# Patient Record
Sex: Female | Born: 1970 | Race: Black or African American | Hispanic: No | Marital: Single | Smoking: Never smoker
Health system: Southern US, Community
[De-identification: ages and names within clinical notes are randomized; demographics above are authoritative.]

## PROBLEM LIST (undated history)

## (undated) DIAGNOSIS — K429 Umbilical hernia without obstruction or gangrene: Secondary | ICD-10-CM

## (undated) DIAGNOSIS — D496 Neoplasm of unspecified behavior of brain: Secondary | ICD-10-CM

## (undated) DIAGNOSIS — N83209 Unspecified ovarian cyst, unspecified side: Secondary | ICD-10-CM

## (undated) HISTORY — PX: EYE SURGERY: SHX253

## (undated) HISTORY — DX: Unspecified ovarian cyst, unspecified side: N83.209

---

## 2010-10-28 ENCOUNTER — Other Ambulatory Visit: Payer: Self-pay | Admitting: Family Medicine

## 2010-10-28 ENCOUNTER — Ambulatory Visit (HOSPITAL_COMMUNITY)
Admission: RE | Admit: 2010-10-28 | Discharge: 2010-10-28 | Payer: Self-pay | Source: Home / Self Care | Attending: Family Medicine | Admitting: Family Medicine

## 2011-11-06 ENCOUNTER — Encounter (HOSPITAL_COMMUNITY): Payer: Self-pay | Admitting: *Deleted

## 2011-11-06 ENCOUNTER — Inpatient Hospital Stay (HOSPITAL_COMMUNITY)
Admission: AD | Admit: 2011-11-06 | Discharge: 2011-11-06 | Disposition: A | Discharge status: Home / Self Care | Payer: Medicaid Other | Source: Ambulatory Visit | Attending: Obstetrics & Gynecology | Admitting: Obstetrics & Gynecology

## 2011-11-06 ENCOUNTER — Inpatient Hospital Stay (HOSPITAL_COMMUNITY): Payer: Medicaid Other

## 2011-11-06 DIAGNOSIS — O200 Threatened abortion: Secondary | ICD-10-CM | POA: Insufficient documentation

## 2011-11-06 HISTORY — DX: Neoplasm of unspecified behavior of brain: D49.6

## 2011-11-06 LAB — DIFFERENTIAL
Basophils Absolute: 0 10*3/uL (ref 0.0–0.1)
Basophils Relative: 0 % (ref 0–1)
Lymphocytes Relative: 26 % (ref 12–46)
Monocytes Relative: 9 % (ref 3–12)
Neutro Abs: 2.3 10*3/uL (ref 1.7–7.7)
Neutrophils Relative %: 62 % (ref 43–77)

## 2011-11-06 LAB — URINALYSIS, ROUTINE W REFLEX MICROSCOPIC
Leukocytes, UA: NEGATIVE
Nitrite: NEGATIVE
Protein, ur: NEGATIVE mg/dL
Specific Gravity, Urine: <1.005 — ABNORMAL LOW (ref 1.005–1.030)
Urobilinogen, UA: 0.2 mg/dL (ref 0.0–1.0)

## 2011-11-06 LAB — ABO/RH: ABO/RH(D): B POS

## 2011-11-06 LAB — CBC
HCT: 32.2 % — ABNORMAL LOW (ref 36.0–46.0)
Hemoglobin: 10.6 g/dL — ABNORMAL LOW (ref 12.0–15.0)
MCHC: 32.9 g/dL (ref 30.0–36.0)
WBC: 3.8 10*3/uL — ABNORMAL LOW (ref 4.0–10.5)

## 2011-11-06 LAB — URINE MICROSCOPIC-ADD ON

## 2011-11-06 LAB — POCT PREGNANCY, URINE: Preg Test, Ur: POSITIVE — AB

## 2011-11-06 LAB — WET PREP, GENITAL
Clue Cells Wet Prep HPF POC: NONE SEEN
WBC, Wet Prep HPF POC: NONE SEEN

## 2011-11-06 NOTE — Progress Notes (Signed)
Pt was seen in [place] for OB checkup on Monday, dx'd with UTI, prescribed antibiotic but is not taking this yet.  Pt came in EMS this morning because she had episode of light bleeding this a.m.  Pt states she had brown discharge yesterday.  Pt denies pain.

## 2011-11-06 NOTE — ED Provider Notes (Signed)
History     CSN: [id number]  Arrival date & time 11/06/11  1010   None     Chief Complaint  Patient presents with  . Vaginal Bleeding    HPI KEYDI GIEL is a 41 y.o. female @ [redacted]w[redacted]d gestation who presents to [facility]U via EMS for vaginal bleeding in early pregnancy. While visiting in [place] 3 days ago went to the ED for lower abdominal pain and was treated for a UTI with Macrobid. Had ultrasound at that visit and told was approximately [redacted] weeks pregnant and "something about tubes but she is not sure what". The history was provided by the patient.  Past Medical History  Diagnosis Date  . Brain tumor     Past Surgical History  Procedure Date  . No past surgeries     History reviewed. No pertinent family history.  History  Substance Use Topics  . Smoking status: Never Smoker   . Smokeless tobacco: Not on file  . Alcohol Use: No    OB History    Grav Para Term Preterm Abortions TAB SAB Ect Mult Living   4 3 3       3       Review of Systems  Constitutional: Negative for fever, chills, diaphoresis and fatigue.  HENT: Negative for ear pain, congestion, sore throat, facial swelling, neck pain, neck stiffness, dental problem and sinus pressure.   Eyes: Negative for photophobia, pain and discharge.  Respiratory: Negative for cough, chest tightness and wheezing.   Cardiovascular: Negative.   Gastrointestinal: Negative for nausea, vomiting, abdominal pain, diarrhea, constipation and abdominal distention.  Genitourinary: Positive for frequency, vaginal bleeding and vaginal discharge. Negative for dysuria, urgency, flank pain, difficulty urinating, vaginal pain and pelvic pain.  Musculoskeletal: Positive for back pain. Negative for myalgias and gait problem.  Skin: Negative for color change and rash.  Neurological: Negative for dizziness, speech difficulty, weakness, light-headedness, numbness and headaches.  Psychiatric/Behavioral: Negative for confusion and agitation.    Allergies    Review of patient's allergies indicates no known allergies.  Home Medications  No current outpatient prescriptions on file.  BP 127/77  Pulse 81  Temp(Src) 97.6 F (36.4 C) (Oral)  Resp 22  LMP 09/18/2011  Physical Exam  Nursing note and vitals reviewed. Constitutional: She is oriented to person, place, and time. She appears well-developed and well-nourished.  HENT:  Head: Normocephalic.  Eyes: Conjunctivae and EOM are normal.  Neck: Neck supple.  Cardiovascular: Normal rate.   Pulmonary/Chest: Effort normal.  Abdominal: Soft. There is no tenderness.  Genitourinary:       External genitalia without lesions. Scant blood vaginal vault. Cervix closed, long, no CMT, no adnexal tenderness, Unable to palpate uterus due to patient habitus.  Musculoskeletal: Normal range of motion.  Neurological: She is alert and oriented to person, place, and time. No cranial nerve deficit.  Skin: Skin is warm and dry.  Psychiatric: She has a normal mood and affect. Her behavior is normal. Judgment and thought content normal.   Results for orders placed during the hospital encounter of 11/06/11 (from the past 24 hour(s))  URINALYSIS, ROUTINE W REFLEX MICROSCOPIC     Status: Abnormal   Collection Time   11/06/11 10:21 AM      Component Value Range   Color, Urine STRAW (*) YELLOW    APPearance CLEAR  CLEAR    Specific Gravity, Urine <1.005 (*) 1.005 - 1.030    pH 5.5  5.0 - 8.0    Glucose, UA  NEGATIVE  NEGATIVE (mg/dL)   Hgb urine dipstick LARGE (*) NEGATIVE    Bilirubin Urine NEGATIVE  NEGATIVE    Ketones, ur NEGATIVE  NEGATIVE (mg/dL)   Protein, ur NEGATIVE  NEGATIVE (mg/dL)   Urobilinogen, UA 0.2  0.0 - 1.0 (mg/dL)   Nitrite NEGATIVE  NEGATIVE    Leukocytes, UA NEGATIVE  NEGATIVE   URINE MICROSCOPIC-ADD ON     Status: Abnormal   Collection Time   11/06/11 10:21 AM      Component Value Range   Squamous Epithelial / LPF MANY (*) RARE    RBC / HPF 3-6  <3 (RBC/hpf)  POCT PREGNANCY, URINE      Status: Abnormal   Collection Time   11/06/11 10:28 AM      Component Value Range   Preg Test, Ur POSITIVE (*) NEGATIVE   WET PREP, GENITAL     Status: Normal   Collection Time   11/06/11 10:54 AM      Component Value Range   Yeast Wet Prep HPF POC NONE SEEN  NONE SEEN    Trich, Wet Prep NONE SEEN  NONE SEEN    Clue Cells Wet Prep HPF POC NONE SEEN  NONE SEEN    WBC, Wet Prep HPF POC NONE SEEN  NONE SEEN   CBC     Status: Abnormal   Collection Time   11/06/11 10:55 AM      Component Value Range   WBC 3.8 (*) 4.0 - 10.5 (K/uL)   RBC 3.63 (*) 3.87 - 5.11 (MIL/uL)   Hemoglobin 10.6 (*) 12.0 - 15.0 (g/dL)   HCT 16.1 (*) 09.6 - 46.0 (%)   MCV 88.7  78.0 - 100.0 (fL)   MCH 29.2  26.0 - 34.0 (pg)   MCHC 32.9  30.0 - 36.0 (g/dL)   RDW 04.5  40.9 - 81.1 (%)   Platelets 166  150 - 400 (K/uL)  DIFFERENTIAL     Status: Normal   Collection Time   11/06/11 10:55 AM      Component Value Range   Neutrophils Relative 62  43 - 77 (%)   Neutro Abs 2.3  1.7 - 7.7 (K/uL)   Lymphocytes Relative 26  12 - 46 (%)   Lymphs Abs 1.0  0.7 - 4.0 (K/uL)   Monocytes Relative 9  3 - 12 (%)   Monocytes Absolute 0.3  0.1 - 1.0 (K/uL)   Eosinophils Relative 2  0 - 5 (%)   Eosinophils Absolute 0.1  0.0 - 0.7 (K/uL)   Basophils Relative 0  0 - 1 (%)   Basophils Absolute 0.0  0.0 - 0.1 (K/uL)  HCG, QUANTITATIVE, PREGNANCY     Status: Abnormal   Collection Time   11/06/11 10:55 AM      Component Value Range   hCG, Beta Chain, Quant, S 2360 (*) <5 (mIU/mL)  ABO/RH     Status: Normal   Collection Time   11/06/11 10:55 AM      Component Value Range   ABO/RH(D) B POS     Ultrasound today shows a 6 week 3 day IUP with Cardiac activity at 78  ED Course: discussed results with Dr. Macon Large in MAU and we will give patient instructions on threatened AB and a pregnancy verification letter to start prenatal care.   Procedures   Assessment: Threatened AB  Plan:  Start prenatal care   Pregnancy verification letter  given. MDM          Randon Somera  Damian Leavell, NP 11/06/11 1255

## 2011-11-06 NOTE — ED Provider Notes (Signed)
Attestation of Attending Supervision of Advanced Practitioner: Evaluation and management procedures were performed by the PA/NP/CNM/OB Fellow under my supervision/collaboration. Chart reviewed, and agree with management and plan.  Hernan Turnage, M.D. 11/06/2011 2:09 PM   

## 2011-11-07 LAB — GC/CHLAMYDIA PROBE AMP, GENITAL: Chlamydia, DNA Probe: NEGATIVE

## 2011-11-10 ENCOUNTER — Inpatient Hospital Stay (HOSPITAL_COMMUNITY)
Admission: AD | Admit: 2011-11-10 | Discharge: 2011-11-10 | Disposition: A | Discharge status: Home / Self Care | Payer: Medicaid Other | Source: Ambulatory Visit | Attending: Obstetrics & Gynecology | Admitting: Obstetrics & Gynecology

## 2011-11-10 ENCOUNTER — Encounter (HOSPITAL_COMMUNITY): Payer: Self-pay | Admitting: *Deleted

## 2011-11-10 ENCOUNTER — Inpatient Hospital Stay (HOSPITAL_COMMUNITY): Payer: Medicaid Other

## 2011-11-10 DIAGNOSIS — O034 Incomplete spontaneous abortion without complication: Secondary | ICD-10-CM

## 2011-11-10 DIAGNOSIS — O039 Complete or unspecified spontaneous abortion without complication: Secondary | ICD-10-CM | POA: Insufficient documentation

## 2011-11-10 LAB — CBC
HCT: 33.2 % — ABNORMAL LOW (ref 36.0–46.0)
Platelets: 206 10*3/uL (ref 150–400)
RBC: 3.74 MIL/uL — ABNORMAL LOW (ref 3.87–5.11)
RDW: 14.3 % (ref 11.5–15.5)
WBC: 4.6 10*3/uL (ref 4.0–10.5)

## 2011-11-10 MED ORDER — IBUPROFEN 800 MG PO TABS: 800.0000 mg | ORAL_TABLET | Freq: Three times a day (TID) | ORAL | Status: AC | PRN

## 2011-11-10 NOTE — ED Provider Notes (Signed)
History     Chief Complaint  Patient presents with  . Vaginal Bleeding   HPI 41 y.o. G4P3003 at [redacted]w[redacted]d with vaginal bleeding, started spotting on 2/6, increased today. No pain. IUP on u/s 2/7, 7 week size with FHR 78.    Past Medical History  Diagnosis Date  . Brain tumor     Past Surgical History  Procedure Date  . No past surgeries     History reviewed. No pertinent family history.  History  Substance Use Topics  . Smoking status: Never Smoker   . Smokeless tobacco: Not on file  . Alcohol Use: No    Allergies: No Known Allergies  Prescriptions prior to admission  Medication Sig Dispense Refill  . bromocriptine (PARLODEL) 2.5 MG tablet Take 2.5 mg by mouth daily.      . nitrofurantoin (MACRODANTIN) 100 MG capsule Take 100 mg by mouth 4 (four) times daily.      . Prenatal Vit-Fe Fumarate-FA (SE-NATAL 19 PO) Take 1 tablet by mouth daily.        Review of Systems  Constitutional: Negative.   Respiratory: Negative.   Cardiovascular: Negative.   Gastrointestinal: Negative for nausea, vomiting, abdominal pain, diarrhea and constipation.  Genitourinary: Negative for dysuria, urgency, frequency, hematuria and flank pain.       Positive for vaginal bleeding   Musculoskeletal: Negative.   Neurological: Negative.   Psychiatric/Behavioral: Negative.    Physical Exam   Blood pressure 112/68, pulse 65, temperature 98.2 F (36.8 C), temperature source Oral, resp. rate 20, height 5' 3.5" (1.613 m), weight 100.245 kg (221 lb), last menstrual period 09/18/2011, SpO2 100.00%.  Physical Exam  Vitals reviewed. Constitutional: She is oriented to person, place, and time. She appears well-developed and well-nourished. No distress.  HENT:  Head: Normocephalic and atraumatic.  Cardiovascular: Normal rate, regular rhythm and normal heart sounds.   Respiratory: Effort normal and breath sounds normal. No respiratory distress.  GI: Soft. Bowel sounds are normal. She exhibits no  distension and no mass. There is no tenderness. There is no rebound and no guarding.  Genitourinary: There is no rash or lesion on the right labia. There is no rash or lesion on the left labia. Uterus is not deviated, not enlarged, not fixed and not tender. Cervix exhibits no motion tenderness, no discharge and no friability. Right adnexum displays no mass, no tenderness and no fullness. Left adnexum displays no mass, no tenderness and no fullness. There is bleeding (small) around the vagina. No erythema or tenderness around the vagina. No vaginal discharge found.       Cervix slightly open   Neurological: She is alert and oriented to person, place, and time.  Skin: Skin is warm and dry.  Psychiatric: She has a normal mood and affect.    MAU Course  Procedures  Results for orders placed during the hospital encounter of 11/10/11 (from the past 48 hour(s))  CBC     Status: Abnormal   Collection Time   11/10/11  4:38 PM      Component Value Range Comment   WBC 4.6  4.0 - 10.5 (K/uL)    RBC 3.74 (*) 3.87 - 5.11 (MIL/uL)    Hemoglobin 10.8 (*) 12.0 - 15.0 (g/dL)    HCT 19.1 (*) 47.8 - 46.0 (%)    MCV 88.8  78.0 - 100.0 (fL)    MCH 28.9  26.0 - 34.0 (pg)    MCHC 32.5  30.0 - 36.0 (g/dL)    RDW 14.3  11.5 - 15.5 (%)    Platelets 206  150 - 400 (K/uL)   HCG, QUANTITATIVE, PREGNANCY     Status: Abnormal   Collection Time   11/10/11  4:38 PM      Component Value Range Comment   hCG, Beta Chain, Quant, S 2733 (*) <5 (mIU/mL)     US Ob Comp Less 14 Wks  11/06/2011  OBSTETRICAL ULTRASOUND: This exam was performed within a [facility] Department. The OB US report was generated in the AS system, and faxed to the ordering physician.   This report is also available in [employer] and in the [employer]. See AS Obstetric US report.   US Ob Transvaginal  11/10/2011  *RADIOLOGY REPORT*  Clinical Data: Vaginal bleeding.  TRANSVAGINAL OB ULTRASOUND  Technique:   Transvaginal ultrasound was performed for evaluation of the gestation as well as the maternal uterus and adnexal regions.  Comparison: Previous ultrasound 5 days ago demonstrated bradycardia concerning for impending fetal demise.  Findings: A single intrauterine gestational sac is redemonstrated with a yolk sac and embryo.   No cardiac activity is seen.   Crown- rump length is 8.4 mm corresponding to gestational age of [redacted] weeks and 6 days compared with gestational age by last menstrual period of 7 weeks 4 days.  Crown-rump length as measured on previous ultrasound from 11/06/11 was 6.3 mm.  There is no subchorionic hemorrhage.  The right and left ovaries are normal.  There is trace free fluid.  IMPRESSION: Findings concerning for fetal demise.   There is no demonstrable cardiac activity on transvaginal ultrasound.  Critical Value/emergent results were called by telephone at the time of interpretation to [facility] in the maternal admissions unit, who verbally acknowledged these results.  Original Report Authenticated By: Elsie Stain, M.D.   US Ob Transvaginal  11/06/2011  OBSTETRICAL ULTRASOUND: This exam was performed within a [facility] Department. The OB US report was generated in the AS system, and faxed to the ordering physician.   This report is also available in [employer] and in the [employer]. See AS Obstetric US report.    Assessment and Plan  41 y.o. G4P3003 at [redacted]w[redacted]d with SAB Counseled on expectant mgmt vs. cytotec - pt opts for expectant mgmt at this time F/U in MAU for quant in 2 weeks, precautions rev'd  Kamyiah Colantonio 11/10/2011, 7:18 PM

## 2011-11-10 NOTE — Progress Notes (Signed)
Started bleeding 02/06, wasn't that much.  Today is heavier.  Few small clots.  No pain.

## 2011-11-10 NOTE — Progress Notes (Signed)
Pt in c/o vaginal bleeding since 11/05/11.  States todays bleeding was the heaviest.   Denies any pain.

## 2011-11-11 ENCOUNTER — Encounter (HOSPITAL_COMMUNITY): Payer: Self-pay | Admitting: Advanced Practice Midwife

## 2011-11-24 ENCOUNTER — Inpatient Hospital Stay (HOSPITAL_COMMUNITY)
Admission: AD | Admit: 2011-11-24 | Discharge: 2011-11-25 | Disposition: A | Discharge status: Home / Self Care | Payer: Medicaid Other | Source: Ambulatory Visit | Attending: Obstetrics & Gynecology | Admitting: Obstetrics & Gynecology

## 2011-11-24 ENCOUNTER — Encounter (HOSPITAL_COMMUNITY): Payer: Self-pay | Admitting: *Deleted

## 2011-11-24 DIAGNOSIS — O039 Complete or unspecified spontaneous abortion without complication: Secondary | ICD-10-CM | POA: Insufficient documentation

## 2011-11-24 DIAGNOSIS — Z09 Encounter for follow-up examination after completed treatment for conditions other than malignant neoplasm: Secondary | ICD-10-CM

## 2011-11-24 NOTE — Progress Notes (Signed)
Pt in for follow up BHCG.  Pt denies bleeding, continues to have cramping.  Pt reports bleeding x 3 days after the last ultrasound.

## 2011-11-25 LAB — HCG, QUANTITATIVE, PREGNANCY: hCG, Beta Chain, Quant, S: 1 m[IU]/mL (ref <5)

## 2011-11-25 NOTE — ED Provider Notes (Signed)
Jordan Mcbride is a 41 y.o. female who presents to [facility]U for follow up Bhcg. She was evaluated here 2 weeks ago and had ultrasound that showed a fetal demise at 6.[redacted] weeks gestation. At that time she opted for expectant management. She states that she had bleeding x 3 days after that visit and none now. Bhcg tonight is 1. Will have patient follow up in GYN Clinic. They will call her to schedule an appointment.  [place], NP 11/25/11 0045 Results for orders placed during the hospital encounter of 11/24/11 (from the past 24 hour(s))  HCG, QUANTITATIVE, PREGNANCY     Status: Normal   Collection Time   11/24/11 11:30 PM      Component Value Range   hCG, Beta Chain, Quant, S 1  <5 (mIU/mL)    Kerrie Buffalo, NP 11/25/11 0045

## 2011-11-25 NOTE — Discharge Instructions (Signed)
SOMEONE FROM THE GYN OFFICE WILL CALL YOU TO SCHEDULE A FOLLOW UP APPOINTMENT. RETURN HERE AS NEEDED.

## 2011-11-26 ENCOUNTER — Inpatient Hospital Stay (HOSPITAL_COMMUNITY): Payer: Medicaid Other

## 2011-11-26 ENCOUNTER — Inpatient Hospital Stay (HOSPITAL_COMMUNITY)
Admission: AD | Admit: 2011-11-26 | Discharge: 2011-11-26 | Disposition: A | Discharge status: Home / Self Care | Payer: Medicaid Other | Source: Ambulatory Visit | Attending: Obstetrics and Gynecology | Admitting: Obstetrics and Gynecology

## 2011-11-26 ENCOUNTER — Encounter (HOSPITAL_COMMUNITY): Payer: Self-pay | Admitting: *Deleted

## 2011-11-26 DIAGNOSIS — R109 Unspecified abdominal pain: Secondary | ICD-10-CM | POA: Insufficient documentation

## 2011-11-26 DIAGNOSIS — N83209 Unspecified ovarian cyst, unspecified side: Secondary | ICD-10-CM | POA: Insufficient documentation

## 2011-11-26 DIAGNOSIS — N949 Unspecified condition associated with female genital organs and menstrual cycle: Secondary | ICD-10-CM | POA: Insufficient documentation

## 2011-11-26 LAB — URINALYSIS, ROUTINE W REFLEX MICROSCOPIC
Leukocytes, UA: NEGATIVE
Nitrite: NEGATIVE
Specific Gravity, Urine: >1.03 — ABNORMAL HIGH (ref 1.005–1.030)
Urobilinogen, UA: 0.2 mg/dL (ref 0.0–1.0)

## 2011-11-26 LAB — CBC
Platelets: 194 10*3/uL (ref 150–400)
RBC: 3.95 MIL/uL (ref 3.87–5.11)
RDW: 14.3 % (ref 11.5–15.5)
WBC: 3.9 10*3/uL — ABNORMAL LOW (ref 4.0–10.5)

## 2011-11-26 LAB — URINE MICROSCOPIC-ADD ON

## 2011-11-26 MED ORDER — KETOROLAC TROMETHAMINE 60 MG/2ML IM SOLN
60.0000 mg | Freq: Once | INTRAMUSCULAR | Status: AC
  Administered 2011-11-26: 60 mg via INTRAMUSCULAR
  Filled 2011-11-26: qty 2

## 2011-11-26 MED ORDER — IBUPROFEN 200 MG PO TABS: 600.0000 mg | ORAL_TABLET | Freq: Four times a day (QID) | ORAL | Status: DC | PRN

## 2011-11-26 MED ORDER — OXYCODONE-ACETAMINOPHEN 5-325 MG PO TABS: 1.0000 | ORAL_TABLET | ORAL | Status: AC | PRN

## 2011-11-26 NOTE — Progress Notes (Signed)
Pt having lower abd pain that has been constant since 1400, feels like squeezing and moves to lower back.  This morning noticed clear discharge.  2/25 BHCG=1.

## 2011-11-26 NOTE — Progress Notes (Signed)
House coverage here.  Language line being accessed.

## 2011-11-26 NOTE — Progress Notes (Signed)
N. Frazier, CNM at bedside.  Assessment done and poc discussed with pt.  

## 2011-11-26 NOTE — Progress Notes (Signed)
Upon provider discussing poc with pt unsure of pt's emotional state.  Pt states no intent to harm self but unsure of language barrier.  Will obtain language line to talk with pt.

## 2011-11-26 NOTE — Progress Notes (Signed)
Pt presents to mau for abdominal pain.  States pain go worse today around 2pm.  States she has been having this pain since February the 7th.

## 2011-11-26 NOTE — ED Provider Notes (Signed)
History     Chief Complaint  Patient presents with  . Abdominal Pain   HPI 41 y.o. [id number] with low abd pain, reports clear discharge, no bleeding. SAB earlier this month, quant HCG was 1 on 2/20. States she has had pain since time of SAB, around 2/7, but worse today. Not taking any pain meds.    Past Medical History  Diagnosis Date  . Brain tumor     Past Surgical History  Procedure Date  . No past surgeries     History reviewed. No pertinent family history.  History  Substance Use Topics  . Smoking status: Never Smoker   . Smokeless tobacco: Not on file  . Alcohol Use: No    Allergies: No Known Allergies  Prescriptions prior to admission  Medication Sig Dispense Refill  . bromocriptine (PARLODEL) 2.5 MG tablet Take 2.5 mg by mouth daily.      . Prenatal Vit-Fe Fumarate-FA (PRENATAL MULTIVITAMIN) TABS Take 1 tablet by mouth daily.      Marland Kitchen DISCONTD: ibuprofen (ADVIL,MOTRIN) 200 MG tablet Take 400 mg by mouth every 6 (six) hours as needed. For pain        Review of Systems  Constitutional: Negative.   Respiratory: Negative.   Cardiovascular: Negative.   Gastrointestinal: Positive for abdominal pain. Negative for nausea, vomiting, diarrhea and constipation.  Genitourinary: Negative for dysuria, urgency, frequency, hematuria and flank pain.       Negative for vaginal bleeding, Positive for vaginal discharge   Musculoskeletal: Negative.   Neurological: Negative.   Psychiatric/Behavioral: Negative.    Physical Exam   Blood pressure 123/71, pulse 89, temperature 97.6 F (36.4 C), resp. rate 18, height 5' 3.5" (1.613 m), weight 220 lb (99.791 kg), last menstrual period 09/18/2011, SpO2 99.00%, not currently breastfeeding.  Physical Exam  Nursing note and vitals reviewed. Constitutional: She is oriented to person, place, and time. She appears well-developed and well-nourished. She appears distressed.  HENT:  Head: Normocephalic and atraumatic.  Cardiovascular:  Normal rate, regular rhythm and normal heart sounds.   Respiratory: Effort normal and breath sounds normal. No respiratory distress.  GI: Soft. Bowel sounds are normal. She exhibits no distension and no mass. There is no tenderness. There is no rebound and no guarding.  Genitourinary: There is no rash or lesion on the right labia. There is no rash or lesion on the left labia. Uterus is tender. Uterus is not deviated, not enlarged and not fixed. Cervix exhibits no motion tenderness, no discharge and no friability. Right adnexum displays tenderness. Right adnexum displays no mass and no fullness. Left adnexum displays tenderness. Left adnexum displays no mass and no fullness. No bleeding around the vagina.  Neurological: She is alert and oriented to person, place, and time.  Skin: Skin is warm and dry.  Psychiatric: She has a normal mood and affect.    MAU Course  Procedures  Results for orders placed during the hospital encounter of 11/26/11 (from the past 24 hour(s))  CBC     Status: Abnormal   Collection Time   11/26/11  8:10 PM      Component Value Range   WBC 3.9 (*) 4.0 - 10.5 (K/uL)   RBC 3.95  3.87 - 5.11 (MIL/uL)   Hemoglobin 11.4 (*) 12.0 - 15.0 (g/dL)   HCT 45.4 (*) 09.8 - 46.0 (%)   MCV 89.4  78.0 - 100.0 (fL)   MCH 28.9  26.0 - 34.0 (pg)   MCHC 32.3  30.0 - 36.0 (g/dL)  RDW 14.3  11.5 - 15.5 (%)   Platelets 194  150 - 400 (K/uL)  URINALYSIS, ROUTINE W REFLEX MICROSCOPIC     Status: Abnormal   Collection Time   11/26/11  8:32 PM      Component Value Range   Color, Urine YELLOW  YELLOW    APPearance CLEAR  CLEAR    Specific Gravity, Urine >1.030 (*) 1.005 - 1.030    pH 6.0  5.0 - 8.0    Glucose, UA NEGATIVE  NEGATIVE (mg/dL)   Hgb urine dipstick TRACE (*) NEGATIVE    Bilirubin Urine NEGATIVE  NEGATIVE    Ketones, ur NEGATIVE  NEGATIVE (mg/dL)   Protein, ur NEGATIVE  NEGATIVE (mg/dL)   Urobilinogen, UA 0.2  0.0 - 1.0 (mg/dL)   Nitrite NEGATIVE  NEGATIVE    Leukocytes,  UA NEGATIVE  NEGATIVE   URINE MICROSCOPIC-ADD ON     Status: Abnormal   Collection Time   11/26/11  8:32 PM      Component Value Range   Squamous Epithelial / LPF FEW (*) RARE    WBC, UA 0-2  <3 (WBC/hpf)   RBC / HPF 3-6  <3 (RBC/hpf)   Bacteria, UA FEW (*) RARE   POCT PREGNANCY, URINE     Status: Normal   Collection Time   11/26/11  9:00 PM      Component Value Range   Preg Test, Ur NEGATIVE  NEGATIVE    US Ob Comp Less 14 Wks  11/06/2011  OBSTETRICAL ULTRASOUND: This exam was performed within a [facility] Ultrasound Department. The OB US report was generated in the AS system, and faxed to the ordering physician.   This report is also available in [employer] and in the [employer]. See AS Obstetric US report.   US Ob Transvaginal  11/10/2011  *RADIOLOGY REPORT*  Clinical Data: Vaginal bleeding.  TRANSVAGINAL OB ULTRASOUND  Technique:  Transvaginal ultrasound was performed for evaluation of the gestation as well as the maternal uterus and adnexal regions.  Comparison: Previous ultrasound 5 days ago demonstrated bradycardia concerning for impending fetal demise.  Findings: A single intrauterine gestational sac is redemonstrated with a yolk sac and embryo.   No cardiac activity is seen.   Crown- rump length is 8.4 mm corresponding to gestational age of [redacted] weeks and 6 days compared with gestational age by last menstrual period of 7 weeks 4 days.  Crown-rump length as measured on previous ultrasound from 11/06/11 was 6.3 mm.  There is no subchorionic hemorrhage.  The right and left ovaries are normal.  There is trace free fluid.  IMPRESSION: Findings concerning for fetal demise.   There is no demonstrable cardiac activity on transvaginal ultrasound.  Critical Value/emergent results were called by telephone at the time of interpretation to [facility] in the maternal admissions unit, who verbally acknowledged these results.  Original Report Authenticated By: Elsie Stain, M.D.   US  Ob Transvaginal  11/06/2011  OBSTETRICAL ULTRASOUND: This exam was performed within a  Ultrasound Department. The OB US report was generated in the AS system, and faxed to the ordering physician.   This report is also available in [employer] and in the [employer]. See AS Obstetric US report.   US Transvaginal Non-ob  11/26/2011  *RADIOLOGY REPORT*  Clinical Data: Recent spontaneous abortion with pelvic pain.  TRANSVAGINAL ULTRASOUND OF PELVIS  Technique:  Transvaginal ultrasound examination of the pelvis was performed including evaluation of the uterus, ovaries, adnexal  regions, and pelvic cul-de-sac.  Comparison:  11/10/2011  Findings:  Uterus:  Uterus appears to be retroflexed.  Normal appearance of the uterus. Uterus measures 7.5 x 4.6 x 5.9 cm.  Endometrium: Endometrial stripe measures 1.5 cm.  Right ovary: Right ovary measures 3.1 x 3.5 x 2.1 cm.  A prominent follicle measuring up to 2.1 cm.  Left ovary: Left ovary measures 3.2 x 2.4 x 2.2 cm.   There is a para ovarian cyst that measures 2.1 x 1.3 x 1.3 cm.  Other Findings:  There may be a trace amount of fluid in the pelvis.  IMPRESSION: No acute findings.  Small cysts or prominent ovarian follicles bilaterally.  Original Report Authenticated By: Richarda Overlie, M.D.   Pain improved with Toradol 60 mg IM Assessment and Plan  41 y.o. [id number] with pelvic pain and ovarian cyst F/U in gyn clinic  Medication List  As of 11/27/2011  1:56 AM   START taking these medications         oxyCODONE-acetaminophen 5-325 MG per tablet   Commonly known as: PERCOCET   Take 1 tablet by mouth every 4 (four) hours as needed for pain.         CHANGE how you take these medications         ibuprofen 200 MG tablet   Commonly known as: ADVIL,MOTRIN   Take 3 tablets (600 mg total) by mouth every 6 (six) hours as needed. For pain   What changed: dose         CONTINUE taking these medications         bromocriptine 2.5 MG tablet    Commonly known as: PARLODEL      prenatal multivitamin Tabs          Where to get your medications    These are the prescriptions that you need to pick up. We sent them to a specific pharmacy, so you will need to go there to get them.   [facility] [address] [address],  - [address] AT [facility] [address]    [address] [place] [address]    Phone: [phone]        ibuprofen 200 MG tablet         You may get these medications from any pharmacy.         oxyCODONE-acetaminophen 5-325 MG per tablet             Lamonte Hartt 11/26/2011, 10:54 PM

## 2011-11-26 NOTE — Progress Notes (Signed)
House coverage notified of needing assistance for language line.  Will be down to assist.

## 2011-12-07 NOTE — ED Provider Notes (Signed)
Attestation of Attending Supervision of Advanced Practitioner: Evaluation and management procedures were performed by the PA/NP/CNM/OB Fellow under my supervision/collaboration. Chart reviewed and agree with management and plan.  Ameah Chanda V 12/07/2011 2:43 PM

## 2012-01-01 ENCOUNTER — Encounter: Payer: Self-pay | Admitting: Obstetrics & Gynecology

## 2012-01-01 ENCOUNTER — Other Ambulatory Visit (HOSPITAL_COMMUNITY)
Admission: RE | Admit: 2012-01-01 | Discharge: 2012-01-01 | Disposition: A | Discharge status: Home / Self Care | Payer: Medicaid Other | Source: Ambulatory Visit | Attending: Obstetrics & Gynecology | Admitting: Obstetrics & Gynecology

## 2012-01-01 ENCOUNTER — Ambulatory Visit (INDEPENDENT_AMBULATORY_CARE_PROVIDER_SITE_OTHER): Payer: Medicaid Other | Admitting: Obstetrics & Gynecology

## 2012-01-01 DIAGNOSIS — Z01419 Encounter for gynecological examination (general) (routine) without abnormal findings: Secondary | ICD-10-CM

## 2012-01-01 DIAGNOSIS — O039 Complete or unspecified spontaneous abortion without complication: Secondary | ICD-10-CM

## 2012-01-01 DIAGNOSIS — Z0000 Encounter for general adult medical examination without abnormal findings: Secondary | ICD-10-CM

## 2012-01-01 DIAGNOSIS — Z113 Encounter for screening for infections with a predominantly sexual mode of transmission: Secondary | ICD-10-CM | POA: Insufficient documentation

## 2012-01-01 NOTE — Progress Notes (Signed)
  Subjective:    Patient ID: Jordan Mcbride, female    DOB: 22-Jun-1971, 41 y.o.   MRN: [id number]  HPI  Jordan Mcbride is here for follow up after a complete spontaneous abortion 2/12. She has no complaints. She has had a period since then and denies intercourse since then. She would like another pregnancy.  Review of Systems Pap is due    Objective:   Physical Exam  Normal appearing cervix     Assessment & Plan:  Doing well Pap done Rec MVI daily RTC 1 year/prn

## 2012-02-19 ENCOUNTER — Encounter (INDEPENDENT_AMBULATORY_CARE_PROVIDER_SITE_OTHER): Payer: Self-pay | Admitting: General Surgery

## 2012-02-19 ENCOUNTER — Ambulatory Visit (INDEPENDENT_AMBULATORY_CARE_PROVIDER_SITE_OTHER): Payer: Medicaid Other | Admitting: General Surgery

## 2012-02-19 VITALS — BP 110/80 | HR 84 | Resp 20 | Ht 64.0 in | Wt 217.0 lb

## 2012-02-19 DIAGNOSIS — K648 Other hemorrhoids: Secondary | ICD-10-CM

## 2012-02-19 NOTE — Patient Instructions (Addendum)
No aspirin or blood thinning products 5 days prior to surgery. Use one fleets enema per rectum the morning of surgery at least 2 hours prior to leaving the house.    GETTING TO GOOD BOWEL HEALTH. Irregular bowel habits such as constipation and diarrhea can lead to many problems over time.  Having one soft bowel movement a day is the most important way to prevent further problems.  The anorectal canal is designed to handle stretching and feces to safely manage our ability to get rid of solid waste (feces, poop, stool) out of our body.  BUT, hard constipated stools can act like ripping concrete bricks and diarrhea can be a burning fire to this very sensitive area of our body, causing inflamed hemorrhoids, anal fissures, increasing risk is perirectal abscesses, abdominal pain/bloating, an making irritable bowel worse.     The goal: ONE SOFT BOWEL MOVEMENT A DAY!  To have soft, regular bowel movements:    Drink at least 8 tall glasses of water a day.     Take plenty of fiber.  Fiber is the undigested part of plant food that passes into the colon, acting s "natures broom" to encourage bowel motility and movement.  Fiber can absorb and hold large amounts of water. This results in a larger, bulkier stool, which is soft and easier to pass. Work gradually over several weeks up to 6 servings a day of fiber (25g a day even more if needed) in the form of: o Vegetables -- Root (potatoes, carrots, turnips), leafy green (lettuce, salad greens, celery, spinach), or cooked high residue (cabbage, broccoli, etc) o Fruit -- Fresh (unpeeled skin & pulp), Dried (prunes, apricots, cherries, etc ),  or stewed ( applesauce)  o Whole grain breads, pasta, etc (whole wheat)  o Bran cereals    Bulking Agents -- This type of water-retaining fiber generally is easily obtained each day by one of the following:  o Psyllium bran -- The psyllium plant is remarkable because its ground seeds can retain so much water. This product is  available as Metamucil, Konsyl, Effersyllium, Per Diem Fiber, or the less expensive generic preparation in drug and health food stores. Although labeled a laxative, it really is not a laxative.  o Methylcellulose -- This is another fiber derived from wood which also retains water. It is available as Citrucel. o Polyethylene Glycol - and "artificial" fiber commonly called Miralax or Glycolax.  It is helpful for people with gassy or bloated feelings with regular fiber o Flax Seed - a less gassy fiber than psyllium   No reading or other relaxing activity while on the toilet. If bowel movements take longer than 5 minutes, you are too constipated   AVOID CONSTIPATION.  High fiber and water intake usually takes care of this.  Sometimes a laxative is needed to stimulate more frequent bowel movements, but    Laxatives are not a good long-term solution as it can wear the colon out. o Osmotics (Milk of Magnesia, Fleets phosphosoda, Magnesium citrate, MiraLax, GoLytely) are safer than  o Stimulants (Senokot, Castor Oil, Dulcolax, Ex Lax)    o Do not take laxatives for more than 7days in a row.    IF SEVERELY CONSTIPATED, try a Bowel Retraining Program: o Do not use laxatives.  o Eat a diet high in roughage, such as bran cereals and leafy vegetables.  o Drink six (6) ounces of prune or apricot juice each morning.  o Eat two (2) large servings of stewed fruit each  day.  o Take one (1) heaping tablespoon of a psyllium-based bulking agent twice a day. Use sugar-free sweetener when possible to avoid excessive calories.  o Eat a normal breakfast.  o Set aside 15 minutes after breakfast to sit on the toilet, but do not strain to have a bowel movement.  o If you do not have a bowel movement by the third day, use an enema and repeat the above steps.  o

## 2012-02-19 NOTE — Progress Notes (Signed)
Patient ID: Jordan Mcbride, female   DOB: January 04, 1971, 41 y.o.   MRN: [id number]  Chief Complaint  Patient presents with  . Hemorrhoids    HPI Jordan Mcbride is a 41 y.o. female.   HPI 41 year old female referred by Dr. Odette Horns for evaluation of chronic hemorrhoids. The patient states that she's had a problem with this particular hemorrhoid for 5 years. This hemorrhoid will protrude after having a bowel movement. The patient has to manually pushed hemorrhoid back in. She states that she has a lot of pain after pushing the hemorrhoid back in. She states that she has a bowel movement every day. She does sit on the commode for little bit of time. She denies any melena or hematochezia. She denies any weight loss. She does strain a little bit. She denies any incontinence. She does complain of a little bit of itching as well. She has never had a hemorrhoidectomy before. She has tried Vaseline as well as hemorrhoidal cream without any relief. Past Medical History  Diagnosis Date  . Brain tumor     micro-pituitary adenoma    Past Surgical History  Procedure Date  . No past surgeries     No family history on file.  Social History History  Substance Use Topics  . Smoking status: Never Smoker   . Smokeless tobacco: Not on file  . Alcohol Use: No    No Known Allergies  Current Outpatient Prescriptions  Medication Sig Dispense Refill  . cabergoline (DOSTINEX) 0.5 MG tablet Take 0.25 mg by mouth 2 (two) times a week.      . cetirizine (ZYRTEC) 10 MG tablet       . ibuprofen (ADVIL,MOTRIN) 200 MG tablet Take 3 tablets (600 mg total) by mouth every 6 (six) hours as needed. For pain  30 tablet  2  . Prenatal Vit-Fe Fumarate-FA (PRENATAL MULTIVITAMIN) TABS Take 1 tablet by mouth daily.        Review of Systems Review of Systems  Constitutional: Negative for fever, chills and unexpected weight change.  HENT: Negative for hearing loss, congestion, sore throat, trouble swallowing and voice  change.   Eyes: Negative for photophobia and visual disturbance.  Respiratory: Negative for cough and wheezing.   Cardiovascular: Negative for chest pain, palpitations and leg swelling.  Gastrointestinal: Negative for nausea, vomiting, abdominal pain, diarrhea, constipation, blood in stool, abdominal distention and anal bleeding.  Genitourinary: Negative for hematuria, vaginal bleeding and difficulty urinating.  Musculoskeletal: Negative for arthralgias.  Skin: Negative for rash and wound.  Neurological: Negative for seizures, syncope and headaches.       Micro-pituitary adenoma- per pt- endocrinologist said no txt needed  Hematological: Negative for adenopathy. Does not bruise/bleed easily.  Psychiatric/Behavioral: Negative for confusion.    Blood pressure 110/80, pulse 84, resp. rate 20, height 5\' 4"  (1.626 m), weight 217 lb (98.431 kg).  Physical Exam Physical Exam  Vitals reviewed. Constitutional: She is oriented to person, place, and time. She appears well-developed and well-nourished. No distress.  HENT:  Head: Normocephalic and atraumatic.  Right Ear: External ear normal.  Left Ear: External ear normal.  Eyes: Conjunctivae are normal. No scleral icterus.  Neck: Neck supple. No tracheal deviation present.  Cardiovascular: Normal rate, regular rhythm and normal heart sounds.   Pulmonary/Chest: Effort normal and breath sounds normal. No respiratory distress. She has no wheezes.  Abdominal: Soft. Bowel sounds are normal. She exhibits no distension. There is no tenderness.  Genitourinary: Rectal exam shows internal hemorrhoid. Rectal exam  shows no external hemorrhoid, no fissure and anal tone normal.       Large pedunculated left anterior int hemorrhoid; about 3cm; appears like a chronic indurated hemorrhoid. Prolapses after anoscopy. Reduces easily. No inguinal LAD  Musculoskeletal: Normal range of motion. She exhibits no edema.  Neurological: She is alert and oriented to person,  place, and time.  Skin: Skin is warm and dry. She is not diaphoretic.  Psychiatric: She has a normal mood and affect. Her behavior is normal. Judgment and thought content normal.    Data Reviewed [facility] note  Assessment    Grade 3 internal hemorrhoid    Plan    We discussed the etiology of hemorrhoids. The patient was given educational material as well as diagrams. We discussed nonoperative and operative management of hemorrhoidal disease.  We discussed the importance of having a daily soft bowel movement and avoiding constipation. We also discussed good bowel habits such as not reading in the bathroom, not straining, and drinking 6-8 glasses of water per day. We also discussed the importance of a high fiber diet. We discussed foods that were high in fiber as well as fiber supplements. We discussed the importance of trying to get 25-30 g of fiber per day in their diet. We discussed the need to start with a low dose of fiber and then gradually increasing their daily fiber dose over several weeks in order to avoid bloating and cramping.  We then discussed different surgical techniques for hemorrhoids, specifically hemorrhoidal banding and excisional hemorrhoidectomy. Given the large size of the internal hemorrhoid and that it is a grade 3 hemorrhoid, I believe non-surgical management will not be successful. Moreover, I do not believe it is amenable to banding  PLAN: Exam under anesthesia, excisional hemorrhoidectomy.  I discussed the procedure in detail.  The patient was given Agricultural engineer.  We discussed the risks and benefits of surgery including, but not limited to bleeding, infection, blood clot formation, anesthesia risk, urinary retention, hemorrhoid recurrence, injury to the sphincters resulting in incontinence, and the rare possibility of anal canal narrowing. I explained that the likelihood of improvement of their symptoms is good  We discussed the typical postoperative  course.  I stressed the importance of not becoming constipated after surgery.  The patient was encouraged to limit pain medication if possible as this increases the likelihood of becoming constipated. The patient was advised to take stool softners & drink 8-10 glasses of non-carbonated, non-alcoholic beverages per day and to eat a high fiber diet.  I also encouraged soaking in a water warm bath for 15 minutes at a time several times a day and after a bowel movement.  The patient was advised to take laxatives such as milk of magnesia or Miralax if no bowel movement three days after surgery.  The patient was advised to expect some blood tinged drainage as well as some blood in their bowel movements.   This appears to be a chronic hemorrhoid however it does have a slightly atypical appearance. My suspicion for an underlying malignancy is very low; however, I did tell the patient that it is possible it could be a malignancy which may require additional procedures depending on the pathology report. Again however my suspicion for malignancy is low  Mary Sella. Andrey Campanile, MD, FACS General, Bariatric, & Minimally Invasive Surgery [facility], [place]         [facility] M 02/19/2012, 2:14 PM

## 2012-02-24 ENCOUNTER — Encounter (INDEPENDENT_AMBULATORY_CARE_PROVIDER_SITE_OTHER): Payer: Self-pay

## 2012-02-26 ENCOUNTER — Other Ambulatory Visit: Payer: Self-pay | Admitting: Ophthalmology

## 2012-03-09 ENCOUNTER — Encounter (HOSPITAL_COMMUNITY): Payer: Self-pay | Admitting: Pharmacy Technician

## 2012-03-15 ENCOUNTER — Inpatient Hospital Stay (HOSPITAL_COMMUNITY): Admission: RE | Admit: 2012-03-15 | Payer: Medicaid Other | Source: Ambulatory Visit

## 2012-03-16 ENCOUNTER — Other Ambulatory Visit: Payer: Self-pay | Admitting: Internal Medicine

## 2012-03-16 DIAGNOSIS — H539 Unspecified visual disturbance: Secondary | ICD-10-CM

## 2012-03-16 DIAGNOSIS — D352 Benign neoplasm of pituitary gland: Secondary | ICD-10-CM

## 2012-03-19 ENCOUNTER — Encounter (HOSPITAL_BASED_OUTPATIENT_CLINIC_OR_DEPARTMENT_OTHER): Payer: Self-pay

## 2012-03-19 ENCOUNTER — Ambulatory Visit (HOSPITAL_BASED_OUTPATIENT_CLINIC_OR_DEPARTMENT_OTHER): Admit: 2012-03-19 | Payer: Self-pay | Admitting: General Surgery

## 2012-03-19 SURGERY — EXAM UNDER ANESTHESIA WITH HEMORRHOIDECTOMY
Anesthesia: General

## 2012-03-22 ENCOUNTER — Inpatient Hospital Stay: Admission: RE | Admit: 2012-03-22 | Payer: Self-pay | Source: Ambulatory Visit

## 2012-03-28 ENCOUNTER — Inpatient Hospital Stay: Admission: RE | Admit: 2012-03-28 | Payer: Self-pay | Source: Ambulatory Visit

## 2012-04-05 ENCOUNTER — Encounter (HOSPITAL_COMMUNITY): Payer: Self-pay | Admitting: Pharmacy Technician

## 2012-04-12 NOTE — Patient Instructions (Addendum)
20 CHRISTABELLE HANZLIK  04/12/2012   Your procedure is scheduled on:  04-21-2012  Report to [facility] at  0630 AM.  Call this number if you have problems the morning of surgery: [phone]   Remember: bring driver for day of surgery Fleets enema morning of surgery  Do not eat food or drink liquids:After Midnight.  .  Take these medicines the morning of surgery with A SIP OF WATER: no meds to take   Do not wear jewelry or make up.  Do not wear lotions, powders, or perfumes.Do not wear deodorant.    Do not bring valuables to the hospital.  Contacts, dentures or bridgework may not be worn into surgery.  Leave suitcase in the car. After surgery it may be brought to your room.  For patients admitted to the hospital, checkout time is 11:00 AM the day of   discharge                             Special Instructions: CHG Shower Use Special Wash: 1/2 bottle night before surgery and 1/2 bottle morning of surgery, use regular soap on face and front and back private area. No shaving for 2 days before showers.   Please read over the following fact sheets that you were given: MRSA Information  [facility] pre op nurse phone number [phone], call if needed

## 2012-04-13 ENCOUNTER — Inpatient Hospital Stay (HOSPITAL_COMMUNITY): Admission: RE | Admit: 2012-04-13 | Discharge: 2012-04-13 | Payer: Self-pay | Source: Ambulatory Visit

## 2012-04-13 NOTE — Pre-Procedure Instructions (Signed)
Dr Andrey Campanile-   Patient has been NO SHOW for 2 pre surgery testing appointments-  We have attempted top call her numerous times at [phone]- there is no voice mail.  When contacted this am regarding  Missed appt she replied she'd call us back.  Her phone was answered by her this PM  after many attempts today and she hung up on me, then did not answer when I called her right back.   Just FYI- WE CANNOT CONTACT THIS PATIENT REGARDING HER SURGERY.

## 2012-04-14 ENCOUNTER — Telehealth (INDEPENDENT_AMBULATORY_CARE_PROVIDER_SITE_OTHER): Payer: Self-pay | Admitting: General Surgery

## 2012-04-14 NOTE — Telephone Encounter (Signed)
Spoke with patient, she would like to cancel her surgery until her husband is in town. She will call back. I will made our scheduling department aware.

## 2012-04-21 ENCOUNTER — Encounter (HOSPITAL_COMMUNITY): Admission: RE | Payer: Self-pay | Source: Ambulatory Visit

## 2012-04-21 ENCOUNTER — Ambulatory Visit (HOSPITAL_COMMUNITY): Admission: RE | Admit: 2012-04-21 | Payer: Medicaid Other | Source: Ambulatory Visit | Admitting: General Surgery

## 2012-04-21 SURGERY — EXAM UNDER ANESTHESIA WITH HEMORRHOIDECTOMY
Anesthesia: General

## 2012-06-17 ENCOUNTER — Ambulatory Visit
Admission: RE | Admit: 2012-06-17 | Discharge: 2012-06-17 | Disposition: A | Discharge status: Home / Self Care | Payer: Self-pay | Source: Ambulatory Visit | Attending: Internal Medicine | Admitting: Internal Medicine

## 2012-06-17 DIAGNOSIS — H539 Unspecified visual disturbance: Secondary | ICD-10-CM

## 2012-06-17 DIAGNOSIS — D352 Benign neoplasm of pituitary gland: Secondary | ICD-10-CM

## 2012-06-17 MED ORDER — GADOBENATE DIMEGLUMINE 529 MG/ML IV SOLN
10.0000 mL | Freq: Once | INTRAVENOUS | Status: AC | PRN
  Administered 2012-06-17: 10 mL via INTRAVENOUS

## 2012-11-08 ENCOUNTER — Ambulatory Visit (INDEPENDENT_AMBULATORY_CARE_PROVIDER_SITE_OTHER): Payer: Medicaid Other | Admitting: Obstetrics & Gynecology

## 2012-11-08 ENCOUNTER — Encounter: Payer: Self-pay | Admitting: Obstetrics & Gynecology

## 2012-11-08 VITALS — BP 140/89 | HR 64 | Temp 98.2°F | Ht 64.0 in | Wt 213.1 lb

## 2012-11-08 DIAGNOSIS — N949 Unspecified condition associated with female genital organs and menstrual cycle: Secondary | ICD-10-CM

## 2012-11-08 DIAGNOSIS — R102 Pelvic and perineal pain: Secondary | ICD-10-CM

## 2012-11-08 NOTE — Patient Instructions (Addendum)
Pelvic Pain Pelvic pain is pain below the belly button and located between your hips. Acute pain may last a few hours or days. Chronic pelvic pain may last weeks and months. The cause may be different for different types of pain. The pain may be dull or sharp, mild or severe and can interfere with your daily activities. Write down and tell your caregiver:   Exactly where the pain is located.  If it comes and goes or is there all the time.  When it happens (with sex, urination, bowel movement, etc.)  If the pain is related to your menstrual period or stress. Your caregiver will take a full history and do a complete physical exam and Pap test. CAUSES   Painful menstrual periods (dysmenorrhea).  Normal ovulation (Mittelschmertz) that occurs in the middle of the menstrual cycle every month.  The pelvic organs get engorged with blood just before the menstrual period (pelvic congestive syndrome).  Scar tissue from an infection or past surgery (pelvic adhesions).  Cancer of the female pelvic organs. When there is pain with cancer, it has been there for a long time.  The lining of the uterus (endometrium) abnormally grows in places like the pelvis and on the pelvic organs (endometriosis).  A form of endometriosis with the lining of the uterus present inside of the muscle tissue of the uterus (adenomyosis).  Fibroid tumor (noncancerous) in the uterus.  Bladder problems such as infection, bladder spasms of the muscle tissue of the bladder.  Intestinal problems (irritable bowel syndrome, colitis, an ulcer or gastrointestinal infection).  Polyps of the cervix or uterus.  Pregnancy in the tube (ectopic pregnancy).  The opening of the cervix is too small for the menstrual blood to flow through it (cervical stenosis).  Physical or sexual abuse (past or present).  Musculo-skeletal problems from poor posture, problems with the vertebrae of the lower back or the uterine pelvic muscles falling  (prolapse).  Psychological problems such as depression or stress.  IUD (intrauterine device) in the uterus. DIAGNOSIS  Tests to make a diagnosis depends on the type, location, severity and what causes the pain to occur. Tests that may be needed include:  Blood tests.  Urine tests  Ultrasound.  X-rays.  CT Scan.  MRI.  Laparoscopy.  Major surgery. TREATMENT  Treatment will depend on the cause of the pain, which includes:  Prescription or over-the-counter pain medication.  Antibiotics.  Birth control pills.  Hormone treatment.  Nerve blocking injections.  Physical therapy.  Antidepressants.  Counseling with a psychiatrist or psychologist.  Minor or major surgery. HOME CARE INSTRUCTIONS   Only take over-the-counter or prescription medicines for pain, discomfort or fever as directed by your caregiver.  Follow your caregiver's advice to treat your pain.  Rest.  Avoid sexual intercourse if it causes the pain.  Apply warm or cold compresses (which ever works best) to the pain area.  Do relaxation exercises such as yoga or meditation.  Try acupuncture.  Avoid stressful situations.  Try group therapy.  If the pain is because of a stomach/intestinal upset, drink clear liquids, eat a bland light food diet until the symptoms go away. SEEK MEDICAL CARE IF:   You need stronger prescription pain medication.  You develop pain with sexual intercourse.  You have pain with urination.  You develop a temperature of 102 F (38.9 C) with the pain.  You are still in pain after 4 hours of taking prescription medication for the pain.  You need depression medication.    Your IUD is causing pain and you want it removed. SEEK IMMEDIATE MEDICAL CARE IF:  You develop very severe pain or tenderness.  You faint, have chills, severe weakness or dehydration.  You develop heavy vaginal bleeding or passing solid tissue.  You develop a temperature of 102 F (38.9 C)  with the pain.  You have blood in the urine.  You are being physically or sexually abused.  You have uncontrolled vomiting and diarrhea.  You are depressed and afraid of harming yourself or someone else. Document Released: 10/23/2004 Document Revised: 12/08/2011 Document Reviewed: 07/20/2008 [employer] Patient Information 2013 [employer].  

## 2012-11-08 NOTE — Progress Notes (Signed)
Patient ID: Jordan Mcbride, female   DOB: [birth year], 42 y.o.   MRN: [id number]  Chief Complaint  Patient presents with  . Ovarian Cyst  . Pelvic Pain    HPI Jordan Mcbride is a 42 y.o. female.  Patient's last menstrual period was 11/05/2012.[id number] Pelvic pain for about 2 months on and off, not associated with menses, which she thinks is due to an ovarian cyst    HPI  Past Medical History  Diagnosis Date  . Brain tumor     micro-pituitary adenoma  . Ovarian cyst     Past Surgical History  Procedure Laterality Date  . Eye surgery      removed eyelid cyst    History reviewed. No pertinent family history.  Social History History  Substance Use Topics  . Smoking status: Never Smoker   . Smokeless tobacco: Never Used  . Alcohol Use: Yes     Comment: socially    No Known Allergies  Current Outpatient Prescriptions  Medication Sig Dispense Refill  . cabergoline (DOSTINEX) 0.5 MG tablet Take 0.25 mg by mouth 2 (two) times a week.       No current facility-administered medications for this visit.    Review of Systems Review of Systems  Genitourinary: Positive for vaginal bleeding and pelvic pain. Negative for menstrual problem.  Neurological: Negative for dizziness and light-headedness.    Blood pressure 140/89, pulse 64, temperature 98.2 F (36.8 C), height 5\' 4"  (1.626 m), weight 213 lb 1.6 oz (96.662 kg), last menstrual period 11/05/2012.  Physical Exam Physical Exam  Constitutional: She is oriented to person, place, and time. No distress.  obese  Pulmonary/Chest: Effort normal. No respiratory distress.  Abdominal: Soft. She exhibits no distension and no mass. There is no tenderness.  Genitourinary: Vagina normal and uterus normal. No vaginal discharge found.  No tenderness or mass  Neurological: She is oriented to person, place, and time.  Skin: Skin is warm and dry.  Psychiatric: She has a normal mood and affect. Her behavior is normal.    Data  Reviewed [place] reports and notes  Assessment    Pelvic pain     Plan    US pelvis, RTC 3 weeks review progress        Mikaiya Tramble 11/08/2012, 5:28 PM

## 2012-11-08 NOTE — Progress Notes (Signed)
States here because she thinks her ovarian cyst came back because she is having pelvic and abd pain since her last miscarriage in [place] in November 2013

## 2012-11-16 ENCOUNTER — Ambulatory Visit (HOSPITAL_COMMUNITY)
Admission: RE | Admit: 2012-11-16 | Discharge: 2012-11-16 | Disposition: A | Discharge status: Home / Self Care | Payer: Medicaid Other | Source: Ambulatory Visit | Attending: Obstetrics & Gynecology | Admitting: Obstetrics & Gynecology

## 2012-11-16 DIAGNOSIS — N949 Unspecified condition associated with female genital organs and menstrual cycle: Secondary | ICD-10-CM | POA: Insufficient documentation

## 2012-11-16 DIAGNOSIS — N838 Other noninflammatory disorders of ovary, fallopian tube and broad ligament: Secondary | ICD-10-CM | POA: Insufficient documentation

## 2012-11-16 DIAGNOSIS — R102 Pelvic and perineal pain: Secondary | ICD-10-CM

## 2012-11-29 ENCOUNTER — Ambulatory Visit (INDEPENDENT_AMBULATORY_CARE_PROVIDER_SITE_OTHER): Payer: Medicaid Other | Admitting: Obstetrics & Gynecology

## 2012-11-29 VITALS — BP 109/74 | HR 74 | Temp 98.0°F | Ht 64.0 in | Wt 205.0 lb

## 2012-11-29 DIAGNOSIS — N949 Unspecified condition associated with female genital organs and menstrual cycle: Secondary | ICD-10-CM

## 2012-11-29 MED ORDER — PRENATAL MULTIVIT-MIN-FE-FA 1 MG PO CAPS: 1.0000 | ORAL_CAPSULE | Freq: Every morning | ORAL | Status: DC

## 2012-11-29 NOTE — Progress Notes (Signed)
Patient ID: Jordan Mcbride, female   DOB: 11-28-1970, 42 y.o.   MRN: [id number] Patient's last menstrual period was 11/05/2012.  Returns for result. [place] was normal, no pain now.  *RADIOLOGY REPORT*  Clinical Data: Pelvic pain for 3 months. LMP 11/05/2012  TRANSABDOMINAL AND TRANSVAGINAL ULTRASOUND OF PELVIS  Technique: Both transabdominal and transvaginal ultrasound  examinations of the pelvis were performed. Transabdominal technique  was performed for global imaging of the pelvis including uterus,  ovaries, adnexal regions, and pelvic cul-de-sac.  It was necessary to proceed with endovaginal exam following the  transabdominal exam to visualize the myometrium, endometrium and  adnexa. .  Comparison: 11/26/2011  Findings:  Uterus: Is retroverted and retroflexed and demonstrates a sagittal  length of 8.1 cm, depth of 5.1 cm and width of 5.9 cm. A  homogeneous myometrium is seen  Endometrium: Has a late tri- layered appearance with a width of 16  mm. This would correlate with a periovulatory endometrial stripe  and correspond with the provided LMP of 11/05/2012. No areas of  focal thickening or heterogeneity are seen.  Right ovary: Measures 3.5 x 1.5 x 2.9 cm and has a normal  appearance  Left ovary: Measures 3.0 x 2.8 x 3.1 cm and contains a collapsing  dominant follicle  Other findings: A small amount of simple free fluid is noted  adjacent to the left ovary. No separate adnexal masses are seen  IMPRESSION:  Normal periovulatory pelvic ultrasound with a small amount of fluid  adjacent to a collapsing dominant follicle in the left ovary  suggesting recent ovulation.  Original Report Authenticated By: Rhodia Albright, M.D.  Imp pelvis pain, possibly mittelschmerz.  Plan Wants to conceive. PNV Rx. Report if sx not improved.  ARNOLD,JAMES 11/29/2012 1:30 PM

## 2012-11-29 NOTE — Patient Instructions (Signed)
Pelvic Pain Pelvic pain is pain below the belly button and located between your hips. Acute pain may last a few hours or days. Chronic pelvic pain may last weeks and months. The cause may be different for different types of pain. The pain may be dull or sharp, mild or severe and can interfere with your daily activities. Write down and tell your caregiver:   Exactly where the pain is located.  If it comes and goes or is there all the time.  When it happens (with sex, urination, bowel movement, etc.)  If the pain is related to your menstrual period or stress. Your caregiver will take a full history and do a complete physical exam and Pap test. CAUSES   Painful menstrual periods (dysmenorrhea).  Normal ovulation (Mittelschmertz) that occurs in the middle of the menstrual cycle every month.  The pelvic organs get engorged with blood just before the menstrual period (pelvic congestive syndrome).  Scar tissue from an infection or past surgery (pelvic adhesions).  Cancer of the female pelvic organs. When there is pain with cancer, it has been there for a long time.  The lining of the uterus (endometrium) abnormally grows in places like the pelvis and on the pelvic organs (endometriosis).  A form of endometriosis with the lining of the uterus present inside of the muscle tissue of the uterus (adenomyosis).  Fibroid tumor (noncancerous) in the uterus.  Bladder problems such as infection, bladder spasms of the muscle tissue of the bladder.  Intestinal problems (irritable bowel syndrome, colitis, an ulcer or gastrointestinal infection).  Polyps of the cervix or uterus.  Pregnancy in the tube (ectopic pregnancy).  The opening of the cervix is too small for the menstrual blood to flow through it (cervical stenosis).  Physical or sexual abuse (past or present).  Musculo-skeletal problems from poor posture, problems with the vertebrae of the lower back or the uterine pelvic muscles falling  (prolapse).  Psychological problems such as depression or stress.  IUD (intrauterine device) in the uterus. DIAGNOSIS  Tests to make a diagnosis depends on the type, location, severity and what causes the pain to occur. Tests that may be needed include:  Blood tests.  Urine tests  Ultrasound.  X-rays.  CT Scan.  MRI.  Laparoscopy.  Major surgery. TREATMENT  Treatment will depend on the cause of the pain, which includes:  Prescription or over-the-counter pain medication.  Antibiotics.  Birth control pills.  Hormone treatment.  Nerve blocking injections.  Physical therapy.  Antidepressants.  Counseling with a psychiatrist or psychologist.  Minor or major surgery. HOME CARE INSTRUCTIONS   Only take over-the-counter or prescription medicines for pain, discomfort or fever as directed by your caregiver.  Follow your caregiver's advice to treat your pain.  Rest.  Avoid sexual intercourse if it causes the pain.  Apply warm or cold compresses (which ever works best) to the pain area.  Do relaxation exercises such as yoga or meditation.  Try acupuncture.  Avoid stressful situations.  Try group therapy.  If the pain is because of a stomach/intestinal upset, drink clear liquids, eat a bland light food diet until the symptoms go away. SEEK MEDICAL CARE IF:   You need stronger prescription pain medication.  You develop pain with sexual intercourse.  You have pain with urination.  You develop a temperature of 102 F (38.9 C) with the pain.  You are still in pain after 4 hours of taking prescription medication for the pain.  You need depression medication.    Your IUD is causing pain and you want it removed. SEEK IMMEDIATE MEDICAL CARE IF:  You develop very severe pain or tenderness.  You faint, have chills, severe weakness or dehydration.  You develop heavy vaginal bleeding or passing solid tissue.  You develop a temperature of 102 F (38.9 C)  with the pain.  You have blood in the urine.  You are being physically or sexually abused.  You have uncontrolled vomiting and diarrhea.  You are depressed and afraid of harming yourself or someone else. Document Released: 10/23/2004 Document Revised: 12/08/2011 Document Reviewed: 07/20/2008 Exit[employer] Patient Information 2013 [employer].  

## 2012-11-30 ENCOUNTER — Encounter: Payer: Self-pay | Admitting: Obstetrics & Gynecology

## 2012-12-07 ENCOUNTER — Other Ambulatory Visit: Payer: Self-pay | Admitting: Ophthalmology

## 2013-01-20 ENCOUNTER — Emergency Department (HOSPITAL_COMMUNITY)
Admission: EM | Admit: 2013-01-20 | Discharge: 2013-01-20 | Disposition: A | Discharge status: Home / Self Care | Payer: Medicaid Other | Attending: Emergency Medicine | Admitting: Emergency Medicine

## 2013-01-20 ENCOUNTER — Encounter (HOSPITAL_COMMUNITY): Payer: Self-pay | Admitting: *Deleted

## 2013-01-20 ENCOUNTER — Emergency Department (HOSPITAL_COMMUNITY): Payer: Medicaid Other

## 2013-01-20 DIAGNOSIS — Z8742 Personal history of other diseases of the female genital tract: Secondary | ICD-10-CM | POA: Insufficient documentation

## 2013-01-20 DIAGNOSIS — Z8669 Personal history of other diseases of the nervous system and sense organs: Secondary | ICD-10-CM | POA: Insufficient documentation

## 2013-01-20 DIAGNOSIS — Z79899 Other long term (current) drug therapy: Secondary | ICD-10-CM | POA: Insufficient documentation

## 2013-01-20 DIAGNOSIS — R059 Cough, unspecified: Secondary | ICD-10-CM | POA: Insufficient documentation

## 2013-01-20 DIAGNOSIS — R6889 Other general symptoms and signs: Secondary | ICD-10-CM | POA: Insufficient documentation

## 2013-01-20 DIAGNOSIS — R05 Cough: Secondary | ICD-10-CM | POA: Insufficient documentation

## 2013-01-20 DIAGNOSIS — M47812 Spondylosis without myelopathy or radiculopathy, cervical region: Secondary | ICD-10-CM | POA: Insufficient documentation

## 2013-01-20 DIAGNOSIS — J029 Acute pharyngitis, unspecified: Secondary | ICD-10-CM | POA: Insufficient documentation

## 2013-01-20 DIAGNOSIS — M549 Dorsalgia, unspecified: Secondary | ICD-10-CM

## 2013-01-20 DIAGNOSIS — M546 Pain in thoracic spine: Secondary | ICD-10-CM | POA: Insufficient documentation

## 2013-01-20 DIAGNOSIS — M479 Spondylosis, unspecified: Secondary | ICD-10-CM

## 2013-01-20 DIAGNOSIS — J3489 Other specified disorders of nose and nasal sinuses: Secondary | ICD-10-CM | POA: Insufficient documentation

## 2013-01-20 MED ORDER — METHOCARBAMOL 500 MG PO TABS: 500.0000 mg | ORAL_TABLET | Freq: Four times a day (QID) | ORAL | Status: DC | PRN

## 2013-01-20 MED ORDER — HYDROCODONE-ACETAMINOPHEN 5-325 MG PO TABS: 1.0000 | ORAL_TABLET | ORAL | Status: DC | PRN

## 2013-01-20 MED ORDER — LORATADINE 10 MG PO TABS: 10.0000 mg | ORAL_TABLET | Freq: Every day | ORAL | Status: DC

## 2013-01-20 NOTE — ED Provider Notes (Signed)
History    This chart was scribed for non-physician practitioner working with Carleene Cooper III, MD by Donne Anon, ED Scribe. This patient was seen in room TR10C/TR10C and the patient's care was started at 2055.   CSN: [id number]  Arrival date & time 01/20/13  1905   First MD Initiated Contact with Patient 01/20/13 2055      Chief Complaint  Patient presents with  . Back Pain     The history is provided by the patient. No language interpreter was used.   Jordan Mcbride is a 42 y.o. female who presents to the Emergency Department complaining of gradual onset, gradually worsening constant upper back pain rated (9/10) which began 2 weeks ago and occasionally radiates to her lower back. She denies any recent injury to her back. She denies weakness or numbness in her arms, fevers, chills, incontinence, difficulty breathing, leg pain, abdominal pain, CP, or any other pain. The pain is worse when she moves. It is not worse when she coughs or takes a deep breath.She has tried ibuprofen without relief.   She reports associated cough, congestion, sore throat, sneezing which began 3 days ago.  Past Medical History  Diagnosis Date  . Brain tumor     micro-pituitary adenoma  . Ovarian cyst     Past Surgical History  Procedure Laterality Date  . Eye surgery      removed eyelid cyst    No family history on file.  History  Substance Use Topics  . Smoking status: Never Smoker   . Smokeless tobacco: Never Used  . Alcohol Use: Yes     Comment: socially    OB History   Grav Para Term Preterm Abortions TAB SAB Ect Mult Living   5 3 3  2  2   3       Review of Systems  Constitutional: Negative for fever and chills.  HENT: Positive for congestion, sore throat and sneezing.   Respiratory: Positive for cough. Negative for shortness of breath.   Gastrointestinal: Negative for vomiting and diarrhea.  Genitourinary: Negative for dysuria and hematuria.  Neurological: Negative for  headaches.  All other systems reviewed and are negative.    Allergies  Review of patient's allergies indicates no known allergies.  Home Medications   Current Outpatient Rx  Name  Route  Sig  Dispense  Refill  . cabergoline (DOSTINEX) 0.5 MG tablet   Oral   Take 0.25 mg by mouth 2 (two) times a week.         Marland Kitchen ibuprofen (ADVIL,MOTRIN) 800 MG tablet   Oral   Take 800 mg by mouth every 8 (eight) hours as needed for pain.           BP 113/74  Pulse 77  Temp(Src) 98.3 F (36.8 C) (Oral)  Resp 18  SpO2 100%  LMP 01/17/2013  Physical Exam  Nursing note and vitals reviewed. Constitutional: She appears well-developed and well-nourished. No distress.  HENT:  Head: Normocephalic and atraumatic.  Neck: Neck supple.  Full active ROM of neck.  Cardiovascular: Normal rate, regular rhythm and normal heart sounds.  Exam reveals no gallop and no friction rub.   No murmur heard. Pulmonary/Chest: Effort normal and breath sounds normal. No respiratory distress. She has no wheezes. She has no rales. She exhibits no tenderness.  Musculoskeletal:       Arms: Spine is without crepitance or step offs. Generalized but no localized tenderness of the thoracic spine. Tenderness over the right trapezius  muscle.  Neurological: She is alert.  Skin: She is not diaphoretic.    ED Course  Procedures (including critical care time) DIAGNOSTIC STUDIES: Oxygen Saturation is 100% on room air, normal by my interpretation.    COORDINATION OF CARE: 9:05 PM Discussed treatment plan which includes cray with pt at bedside and pt agreed to plan.   9:54 PM Rechecked pt. Informed of xray results.   Labs Reviewed - No data to display Dg Thoracic Spine 2 View  01/20/2013  *RADIOLOGY REPORT*  Clinical Data: Upper back pain without trauma.  THORACIC SPINE - 2 VIEW  Comparison: None.  Findings: Frontal view demonstrates minimal S-shaped spinal curvature.  Mild cardiomegaly is suspected, suboptimally  evaluated.  The lateral view images from approximately T4-L2.  Maintenance of vertebral body height across these levels.  Moderate mid thoracic spondylosis.  Swimmer's view demonstrates maintenance of T1-T5 vertebral body heights.  Cervical spondylosis incidentally noted at C4-C5 and C5- C6.  IMPRESSION: Minimal spinal curvature, with age advanced mid thoracic spondylosis, without acute osseous abnormality.   Original Report Authenticated By: Jeronimo Greaves, M.D.      1. Upper back pain on right side   2. Spondylosis      MDM  Pt with diffuse tenderness over right upper back without skin changes.  Tender to palpation, worse with movement.  Likely musculoskeletal.  No chest pain, SOB. Pain is not pleuritic. Doubt PE, ACS.  Neurovascularly intact.  Doubt acute cord pathology.  D/C home with robaxin, vicodin, PCP follow up.  Pt also c/o seasonal allergies not responding to OTC treatment - noted after visit, requested allergy medication.  D/C with claritin.  Discussed all results with patient.  Pt given return precautions.  Pt verbalizes understanding and agrees with plan.      I personally performed the services described in this documentation, which was scribed in my presence. The recorded information has been reviewed and is accurate.         Trixie Dredge, PA-C 01/21/13 0001

## 2013-01-20 NOTE — ED Notes (Signed)
The pt has had back pain for 2 weeks.  No previous history.  She denies  Injury.  Swelling and itching eyes

## 2013-01-20 NOTE — ED Notes (Signed)
Pt discharged.Vital signs stable and GCS 15 

## 2013-01-21 NOTE — ED Provider Notes (Signed)
Medical screening examination/treatment/procedure(s) were performed by non-physician practitioner and as supervising physician I was immediately available for consultation/collaboration.   Carleene Cooper III, MD 01/21/13 [phone]

## 2013-02-11 ENCOUNTER — Encounter (HOSPITAL_COMMUNITY): Payer: Self-pay | Admitting: Emergency Medicine

## 2013-02-11 DIAGNOSIS — Y939 Activity, unspecified: Secondary | ICD-10-CM | POA: Insufficient documentation

## 2013-02-11 DIAGNOSIS — Z3202 Encounter for pregnancy test, result negative: Secondary | ICD-10-CM | POA: Insufficient documentation

## 2013-02-11 DIAGNOSIS — IMO0002 Reserved for concepts with insufficient information to code with codable children: Secondary | ICD-10-CM | POA: Insufficient documentation

## 2013-02-11 DIAGNOSIS — Z86011 Personal history of benign neoplasm of the brain: Secondary | ICD-10-CM | POA: Insufficient documentation

## 2013-02-11 DIAGNOSIS — Y929 Unspecified place or not applicable: Secondary | ICD-10-CM | POA: Insufficient documentation

## 2013-02-11 DIAGNOSIS — X58XXXA Exposure to other specified factors, initial encounter: Secondary | ICD-10-CM | POA: Insufficient documentation

## 2013-02-11 DIAGNOSIS — N946 Dysmenorrhea, unspecified: Secondary | ICD-10-CM | POA: Insufficient documentation

## 2013-02-11 DIAGNOSIS — Z79899 Other long term (current) drug therapy: Secondary | ICD-10-CM | POA: Insufficient documentation

## 2013-02-11 DIAGNOSIS — Z8742 Personal history of other diseases of the female genital tract: Secondary | ICD-10-CM | POA: Insufficient documentation

## 2013-02-11 DIAGNOSIS — N898 Other specified noninflammatory disorders of vagina: Secondary | ICD-10-CM | POA: Insufficient documentation

## 2013-02-11 LAB — CBC WITH DIFFERENTIAL/PLATELET
Basophils Absolute: 0 10*3/uL (ref 0.0–0.1)
Eosinophils Absolute: 0.2 10*3/uL (ref 0.0–0.7)
Eosinophils Relative: 4 % (ref 0–5)
MCH: 29.3 pg (ref 26.0–34.0)
MCHC: 33.8 g/dL (ref 30.0–36.0)
MCV: 86.7 fL (ref 78.0–100.0)
Platelets: 181 10*3/uL (ref 150–400)
RDW: 13.4 % (ref 11.5–15.5)
WBC: 4.6 10*3/uL (ref 4.0–10.5)

## 2013-02-11 NOTE — ED Notes (Signed)
PT. REPORTS LOW ABDOMINAL PAIN FOR 3 DAYS AND RIGHT UPPER BACK PAIN WHEN COUGHING , DENIES NAUSEA/VOMITTING OR DIARRHEA.

## 2013-02-12 ENCOUNTER — Emergency Department (HOSPITAL_COMMUNITY)
Admission: EM | Admit: 2013-02-12 | Discharge: 2013-02-12 | Disposition: A | Discharge status: Home / Self Care | Payer: Medicaid Other | Attending: Emergency Medicine | Admitting: Emergency Medicine

## 2013-02-12 DIAGNOSIS — T148XXA Other injury of unspecified body region, initial encounter: Secondary | ICD-10-CM

## 2013-02-12 DIAGNOSIS — N946 Dysmenorrhea, unspecified: Secondary | ICD-10-CM

## 2013-02-12 LAB — URINE MICROSCOPIC-ADD ON

## 2013-02-12 LAB — WET PREP, GENITAL
WBC, Wet Prep HPF POC: NONE SEEN
Yeast Wet Prep HPF POC: NONE SEEN

## 2013-02-12 LAB — COMPREHENSIVE METABOLIC PANEL
ALT: 13 U/L (ref 0–35)
AST: 14 U/L (ref 0–37)
Calcium: 9.2 mg/dL (ref 8.4–10.5)
GFR calc Af Amer: >90 mL/min (ref >90)
Sodium: 141 mEq/L (ref 135–145)
Total Protein: 7.2 g/dL (ref 6.0–8.3)

## 2013-02-12 LAB — URINALYSIS, ROUTINE W REFLEX MICROSCOPIC
Bilirubin Urine: NEGATIVE
Protein, ur: NEGATIVE mg/dL
Urobilinogen, UA: 0.2 mg/dL (ref 0.0–1.0)

## 2013-02-12 MED ORDER — KETOROLAC TROMETHAMINE 60 MG/2ML IM SOLN
60.0000 mg | Freq: Once | INTRAMUSCULAR | Status: AC
  Administered 2013-02-12: 60 mg via INTRAMUSCULAR
  Filled 2013-02-12: qty 2

## 2013-02-12 MED ORDER — OXYCODONE-ACETAMINOPHEN 5-325 MG PO TABS: 1.0000 | ORAL_TABLET | Freq: Four times a day (QID) | ORAL | Status: DC | PRN

## 2013-02-12 NOTE — Discharge Instructions (Signed)
Dysmenorrhea  Menstrual pain is caused by the muscles of the uterus tightening (contracting) during a menstrual period. The muscles of the uterus contract due to the chemicals in the uterine lining.  Primary dysmenorrhea is menstrual cramps that last a couple of days when you start having menstrual periods or soon after. This often begins after a teenager starts having her period. As a woman gets older or has a baby, the cramps will usually lesson or disappear.  Secondary dysmenorrhea begins later in life, lasts longer, and the pain may be stronger than primary dysmenorrhea. The pain may start before the period and last a few days after the period. This type of dysmenorrhea is usually caused by an underlying problem such as:  · The tissue lining the uterus grows outside of the uterus in other areas of the body (endometriosis).  · The endometrial tissue, which normally lines the uterus, is found in or grows into the muscular walls of the uterus (adenomyosis).  · The pelvic blood vessels are engorged with blood just before the menstrual period (pelvic congestive syndrome).  · Overgrowth of cells in the lining of the uterus or cervix (polyps of the uterus or cervix).  · Falling down of the uterus (prolapse) because of loose or stretched ligaments.  · Depression.  · Bladder problems, infection, or inflammation.  · Problems with the intestine, a tumor, or irritable bowel syndrome.  · Cancer of the female organs or bladder.  · A severely tipped uterus.  · A very tight opening or closed cervix.  · Noncancerous tumors of the uterus (fibroids).  · Pelvic inflammatory disease (PID).  · Pelvic scarring (adhesions) from a previous surgery.  · Ovarian cyst.  · An intrauterine device (IUD) used for birth control.  CAUSES   The cause of menstrual pain is often unknown.  SYMPTOMS   · Cramping or throbbing pain in your lower abdomen.  · Sometimes, a woman may also experience headaches.  · Lower back pain.  · Feeling sick to your  stomach (nausea) or vomiting.  · Diarrhea.  · Sweating or dizziness.  DIAGNOSIS   A diagnosis is based on your history, symptoms, physical examination, diagnostic tests, or procedures. Diagnostic tests or procedures may include:  · Blood tests.  · An ultrasound.  · An examination of the lining of the uterus (dilation and curettage, D&C).  · An examination inside your abdomen or pelvis with a scope (laparoscopy).  · X-rays.  · CT Scan.  · MRI.  · An examination inside the bladder with a scope (cystoscopy).  · An examination inside the intestine or stomach with a scope (colonoscopy, gastroscopy).  TREATMENT   Treatment depends on the cause of the dysmenorrhea. Treatment may include:  · Pain medicine prescribed by your caregiver.  · Birth control pills.  · Hormone replacement therapy.  · Nonsteroidal anti-inflammatory drugs (NSAIDs). These may help stop the production of prostaglandins.  · An IUD with progesterone hormone in it.  · Acupuncture.  · Surgery to remove adhesions, endometriosis, ovarian cyst, or fibroids.  · Removal of the uterus (hysterectomy).  · Progesterone shots to stop the menstrual period.  · Cutting the nerves on the sacrum that go to the female organs (presacral neurectomy).  · Electric currant to the sacral nerves (sacral nerve stimulation).  · Antidepressant medicine.  · Psychiatric therapy, counseling, or group therapy.  · Exercise and physical therapy.  · Meditation and yoga therapy.  HOME CARE INSTRUCTIONS   · Only take over-the-counter   or prescription medicines for pain, discomfort, or fever as directed by your caregiver.  · Place a heating pad or hot water bottle on your lower back or abdomen. Do not sleep with the heating pad.  · Use aerobic exercises, walking, swimming, biking, and other exercises to help lessen the cramping.  · Massage to the lower back or abdomen may help.  · Stop smoking.  · Avoid alcohol and caffeine.  · Yoga, meditation, or acupuncture may help.  SEEK MEDICAL CARE IF:    · The pain does not get better with medicine.  · You have pain with sexual intercourse.  SEEK IMMEDIATE MEDICAL CARE IF:   · Your pain increases and is not controlled with medicines.  · You have a fever.  · You develop nausea or vomiting with your period not controlled with medicine.  · You have abnormal vaginal bleeding with your period.  · You pass out.  MAKE SURE YOU:   · Understand these instructions.  · Will watch your condition.  · Will get help right away if you are not doing well or get worse.  Document Released: 09/15/2005 Document Revised: 12/08/2011 Document Reviewed: 01/01/2009  [employer]® Patient Information ©2013 [employer].

## 2013-02-12 NOTE — ED Provider Notes (Addendum)
History     CSN: [id number]  Arrival date & time 02/11/13  2259   First MD Initiated Contact with Patient 02/12/13 0216      Chief Complaint  Patient presents with  . Abdominal Pain  . Back Pain    (Consider location/radiation/quality/duration/timing/severity/associated sxs/prior treatment) Patient is a 42 y.o. female presenting with abdominal pain and back pain. The history is provided by the patient.  Abdominal Pain Pain location:  Suprapubic Pain quality: aching and cramping   Pain radiates to:  Does not radiate Pain severity:  Moderate Onset quality:  Gradual Duration:  3 days Timing:  Intermittent Progression:  Unchanged Chronicity:  New Context comment:  Recently started menses with her normal vaginal bleeding. No clots Relieved by:  Nothing Worsened by:  Nothing tried Ineffective treatments:  None tried Associated symptoms: vaginal bleeding   Associated symptoms: no anorexia, no chest pain, no chills, no constipation, no diarrhea, no dysuria, no fever, no nausea, no shortness of breath, no vaginal discharge and no vomiting   Risk factors: no aspirin use, has not had multiple surgeries and not pregnant   Back Pain Location:  Thoracic spine Quality:  Aching, cramping and shooting Radiates to:  Does not radiate Pain severity:  Moderate Pain is:  Worse during the day Onset quality:  Gradual Duration:  2 months Timing:  Intermittent Progression:  Worsening Chronicity:  Recurrent Context comment:  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 Brain tumor    micro-pituitary adenoma  . Ovarian cyst     Past Surgical History  Procedure Laterality Date  . Eye surgery      removed eyelid cyst    No family history on file.  History  Substance Use Topics  . Smoking status: Never Smoker   . Smokeless tobacco: Never Used  . Alcohol Use: Yes     Comment: socially    OB History   Grav Para Term Preterm Abortions TAB SAB Ect Mult Living   5 3 3  2  2   3       Review of Systems  Constitutional: Negative for fever and chills.  Respiratory: Negative for shortness of breath.   Cardiovascular: Negative for chest pain.  Gastrointestinal: Positive for abdominal pain. Negative for nausea, vomiting, diarrhea, constipation and anorexia.  Genitourinary: Positive for vaginal bleeding. Negative for dysuria and vaginal discharge.  Musculoskeletal: Positive for back pain.  All other systems reviewed and are negative.    Allergies  Review of patient's allergies indicates no known allergies.  Home Medications   Current Outpatient Rx  Name  Route  Sig  Dispense  Refill  . HYDROcodone-acetaminophen (NORCO/VICODIN) 5-325 MG per tablet   Oral   Take 1 tablet by mouth every 4 (four) hours as needed for pain.   10 tablet   0   . loratadine (CLARITIN) 10 MG tablet   Oral   Take 1 tablet (10 mg total) by mouth daily.   20 tablet   0   . methocarbamol (ROBAXIN) 500 MG tablet   Oral   Take 1 tablet (500 mg total)  by mouth 4 (four) times daily as needed.   15 tablet   0   . Multiple Vitamin (MULTIVITAMIN WITH MINERALS) TABS   Oral   Take 1 tablet by mouth daily.         . cabergoline (DOSTINEX) 0.5 MG tablet   Oral   Take 0.25 mg by mouth 2 (two) times a week.           BP 134/82  Pulse 65  Temp(Src) 99 F (37.2 C) (Oral)  Resp 14  SpO2 100%  LMP 02/09/2013  Physical Exam  Nursing note and vitals reviewed. Constitutional: She is oriented to person, place, and time. She appears well-developed and well-nourished. No distress.   HENT:  Head: Normocephalic and atraumatic.  Mouth/Throat: Oropharynx is clear and moist.  Eyes: Conjunctivae and EOM are normal. Pupils are equal, round, and reactive to light.  Neck: Normal range of motion. Neck supple. Muscular tenderness present. No spinous process tenderness present.    Cardiovascular: Normal rate, regular rhythm and intact distal pulses.   No murmur heard. Pulmonary/Chest: Effort normal and breath sounds normal. No respiratory distress. She has no wheezes. She has no rales.  Abdominal: Soft. Normal appearance. She exhibits no distension. There is tenderness in the suprapubic area. There is no rebound and no guarding.  Genitourinary: Uterus normal. Uterus is not tender. Cervix exhibits no motion tenderness and no friability. Right adnexum displays no mass, no tenderness and no fullness. Left adnexum displays no mass, no tenderness and no fullness. There is bleeding around the vagina. No foreign body around the vagina.  Musculoskeletal: Normal range of motion. She exhibits no edema and no tenderness.  Neurological: She is alert and oriented to person, place, and time.  Skin: Skin is warm and dry. No rash noted. No erythema.  Psychiatric: She has a normal mood and affect. Her behavior is normal.    ED Course  Procedures (including critical care time)  Labs Reviewed  WET PREP, GENITAL - Abnormal; Notable for the following:    Clue Cells Wet Prep HPF POC FEW (*)    All other components within normal limits  URINALYSIS, ROUTINE W REFLEX MICROSCOPIC - Abnormal; Notable for the following:    Hgb urine dipstick LARGE (*)    All other components within normal limits  CBC WITH DIFFERENTIAL - Abnormal; Notable for the following:    Hemoglobin 11.9 (*)    HCT 35.2 (*)    All other components within normal limits  COMPREHENSIVE METABOLIC PANEL - Abnormal; Notable for the following:    Total Bilirubin 0.2 (*)    GFR calc non Af Amer 81 (*)    All other components within  normal limits  GC/CHLAMYDIA PROBE AMP  URINE MICROSCOPIC-ADD ON  PREGNANCY, URINE  PREGNANCY, URINE   No results found.   1. Dysmenorrhea   2. Muscle strain       MDM  Patient presenting with 2 separate complaints. The last 3 days she's had vaginal bleeding and lower abdominal pain. On exam she has a bleeding from the vaginal vault but no cervical motion tenderness or adnexal tenderness. Low concern for ovarian torsion at this time and feel patient's abdominal pain is due to menstrual cramping. Her UPT is negative and UA is negative for infection. She denies any dysuria or flank pain. She has no systemic symptoms such as vomiting, diarrhea or fever. She has no significant tenderness on abdominal exam.  Secondly patient is complaining of upper back pain it's worse  when she moves her arm, cough and takes deep breaths. The pain appears to be all musculoskeletal. She's been seen in the past given hydrocodone and soma without improvement of her symptoms. 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 Her pain is reproducible by palpation and movement of her arms. Discussed with her that this is most likely musculoskeletal given pain control and have her followup with orthopedics.  4:12 AM Wet prep without significant findings.  Will d/c home with pain control.      Gwyneth Sprout, MD 02/12/13 2130  Gwyneth Sprout, MD 02/12/13 [phone]

## 2013-06-20 ENCOUNTER — Emergency Department (HOSPITAL_COMMUNITY)
Admission: EM | Admit: 2013-06-20 | Discharge: 2013-06-20 | Disposition: A | Discharge status: Home / Self Care | Payer: Medicaid Other | Attending: Emergency Medicine | Admitting: Emergency Medicine

## 2013-06-20 ENCOUNTER — Encounter (HOSPITAL_COMMUNITY): Payer: Self-pay | Admitting: Emergency Medicine

## 2013-06-20 DIAGNOSIS — H02849 Edema of unspecified eye, unspecified eyelid: Secondary | ICD-10-CM | POA: Insufficient documentation

## 2013-06-20 DIAGNOSIS — H5789 Other specified disorders of eye and adnexa: Secondary | ICD-10-CM | POA: Insufficient documentation

## 2013-06-20 DIAGNOSIS — R1310 Dysphagia, unspecified: Secondary | ICD-10-CM | POA: Insufficient documentation

## 2013-06-20 DIAGNOSIS — T7840XA Allergy, unspecified, initial encounter: Secondary | ICD-10-CM

## 2013-06-20 DIAGNOSIS — Z9889 Other specified postprocedural states: Secondary | ICD-10-CM | POA: Insufficient documentation

## 2013-06-20 DIAGNOSIS — L299 Pruritus, unspecified: Secondary | ICD-10-CM | POA: Insufficient documentation

## 2013-06-20 DIAGNOSIS — R070 Pain in throat: Secondary | ICD-10-CM | POA: Insufficient documentation

## 2013-06-20 DIAGNOSIS — T783XXA Angioneurotic edema, initial encounter: Secondary | ICD-10-CM | POA: Insufficient documentation

## 2013-06-20 DIAGNOSIS — Z86011 Personal history of benign neoplasm of the brain: Secondary | ICD-10-CM | POA: Insufficient documentation

## 2013-06-20 DIAGNOSIS — Z8742 Personal history of other diseases of the female genital tract: Secondary | ICD-10-CM | POA: Insufficient documentation

## 2013-06-20 DIAGNOSIS — T498X5A Adverse effect of other topical agents, initial encounter: Secondary | ICD-10-CM | POA: Insufficient documentation

## 2013-06-20 MED ORDER — FAMOTIDINE 20 MG PO TABS: 20.0000 mg | ORAL_TABLET | Freq: Two times a day (BID) | ORAL | Status: DC

## 2013-06-20 MED ORDER — IBUPROFEN 800 MG PO TABS
800.0000 mg | ORAL_TABLET | Freq: Once | ORAL | Status: AC
  Administered 2013-06-20: 800 mg via ORAL
  Filled 2013-06-20: qty 1

## 2013-06-20 MED ORDER — DIPHENHYDRAMINE HCL 25 MG PO TABS: 25.0000 mg | ORAL_TABLET | Freq: Four times a day (QID) | ORAL | Status: DC

## 2013-06-20 MED ORDER — EPINEPHRINE 0.3 MG/0.3ML IJ SOAJ
0.3000 mg | Freq: Once | INTRAMUSCULAR | Status: AC
  Administered 2013-06-20: 0.3 mg via INTRAMUSCULAR
  Filled 2013-06-20: qty 0.3

## 2013-06-20 MED ORDER — PREDNISONE 20 MG PO TABS: 60.0000 mg | ORAL_TABLET | Freq: Every day | ORAL | Status: DC

## 2013-06-20 MED ORDER — METHYLPREDNISOLONE SODIUM SUCC 125 MG IJ SOLR
125.0000 mg | Freq: Four times a day (QID) | INTRAMUSCULAR | Status: DC
  Administered 2013-06-20: 125 mg via INTRAVENOUS
  Filled 2013-06-20: qty 2

## 2013-06-20 MED ORDER — EPINEPHRINE 0.3 MG/0.3ML IJ SOAJ: 0.3000 mg | Freq: Once | INTRAMUSCULAR | Status: DC

## 2013-06-20 MED ORDER — FAMOTIDINE IN NACL 20-0.9 MG/50ML-% IV SOLN
20.0000 mg | Freq: Two times a day (BID) | INTRAVENOUS | Status: DC
  Administered 2013-06-20: 20 mg via INTRAVENOUS
  Filled 2013-06-20: qty 50

## 2013-06-20 MED ORDER — DIPHENHYDRAMINE HCL 50 MG/ML IJ SOLN
25.0000 mg | Freq: Once | INTRAMUSCULAR | Status: AC
  Administered 2013-06-20: 25 mg via INTRAVENOUS
  Filled 2013-06-20: qty 1

## 2013-06-20 NOTE — ED Notes (Signed)
Feels ibuprofen will take care of headache and wants to leave now.

## 2013-06-20 NOTE — ED Notes (Signed)
Pt woke up pta with upper lip swelling. Denies sob.  Reports eating at a buffet around 7pm and having generalized itching all over prior to going to bed.  No known allergies.  Does not take BP medications.

## 2013-06-20 NOTE — ED Notes (Signed)
Upper lip swollen approx 4-5 times normal size, left eye edema noted.  Pt denies difficulty swallowing or breathing.  Is tearful due to itching of areas mentioned.

## 2013-06-20 NOTE — ED Notes (Signed)
Continues to sleep, edema continues to lessen.

## 2013-06-20 NOTE — ED Notes (Signed)
Pt assisted to BR by staff member.  Pt tolerated activity well.  Lips and eye edema diminishing.  Will continue to observe.

## 2013-06-20 NOTE — ED Provider Notes (Signed)
CSN: [id number]     Arrival date & time 06/20/13  0159 History   First MD Initiated Contact with Patient 06/20/13 [phone]     Chief Complaint  Patient presents with  . Facial Swelling   (Consider location/radiation/quality/duration/timing/severity/associated sxs/prior Treatment) HPI 42 yo female presents to the ER from home with complaint of diffuse itching, swelling of lip, eyes, and throat pain.  Pt had itching of hands prior to sleep last night.  Reports similar reaction in the very remote past, did not know what she was allergic to at that time.  Last night ate at a buffet, had meat, spinach.  No known allergens eating.  No difficulties breathing, no throat closing sensation.  Pain with swallowing.    Past Medical History  Diagnosis Date  . Brain tumor     micro-pituitary adenoma  . Ovarian cyst    Past Surgical History  Procedure Laterality Date  . Eye surgery      removed eyelid cyst   No family history on file. History  Substance Use Topics  . Smoking status: Never Smoker   . Smokeless tobacco: Never Used  . Alcohol Use: Yes     Comment: socially   OB History   Grav Para Term Preterm Abortions TAB SAB Ect Mult Living   5 3 3  2  2   3      Review of Systems  All other systems reviewed and are negative.    Allergies  Review of patient's allergies indicates no known allergies.  Home Medications   Current Outpatient Rx  Name  Route  Sig  Dispense  Refill  . cabergoline (DOSTINEX) 0.5 MG tablet   Oral   Take 0.25 mg by mouth 2 (two) times a week.         . methocarbamol (ROBAXIN) 500 MG tablet   Oral   Take 1 tablet (500 mg total) by mouth 4 (four) times daily as needed.   15 tablet   0    BP 146/112  Pulse 89  Temp(Src) 98.7 F (37.1 C) (Oral)  Resp 20  SpO2 99%  LMP 06/10/2013 Physical Exam  Nursing note and vitals reviewed. Constitutional: She is oriented to person, place, and time. She appears well-developed and well-nourished.  HENT:  Head:  Normocephalic and atraumatic.  Right Ear: External ear normal.  Left Ear: External ear normal.  Nose: Nose normal.  Angioedema of upper lip, edema of eyelids, upper face.  No swelling of lower lip, tongue, or posterior pharynx/uvula  Eyes: Conjunctivae and EOM are normal. Pupils are equal, round, and reactive to light.  Neck: Normal range of motion. Neck supple. No JVD present. No tracheal deviation present. No thyromegaly present.  Cardiovascular: Normal rate, regular rhythm, normal heart sounds and intact distal pulses.  Exam reveals no gallop and no friction rub.   No murmur heard. Pulmonary/Chest: Effort normal and breath sounds normal. No stridor. No respiratory distress. She has no wheezes. She has no rales. She exhibits no tenderness.  Abdominal: Soft. Bowel sounds are normal. She exhibits no distension and no mass. There is no tenderness. There is no rebound and no guarding.  Musculoskeletal: Normal range of motion. She exhibits no edema and no tenderness.  Lymphadenopathy:    She has no cervical adenopathy.  Neurological: She is alert and oriented to person, place, and time. She exhibits normal muscle tone. Coordination normal.  Skin: Skin is warm and dry. No rash noted. No erythema. No pallor.  Psychiatric: She has  a normal mood and affect. Her behavior is normal. Judgment and thought content normal.    ED Course  Procedures (including critical care time) Labs Review Labs Reviewed - No data to display Imaging Review No results found.  MDM   1. Allergic reaction, initial encounter   2. Allergic angioedema, initial encounter     42 yo female with allergic reaction, angioedema.  Will give epipen, benadryl, pepcid, solumedrol.  Will monitor closely for 4 hours.  6:27 AM sxs are much improved, still with some swelling to eyes, lip.  Will continue medications.  Pt instructed will need close f/u with allergist.    Olivia Mackie, MD 06/20/13 [phone]

## 2013-06-20 NOTE — ED Notes (Signed)
Pt c/o headache; asking for ibuprofen 800mg .

## 2013-12-24 ENCOUNTER — Emergency Department (HOSPITAL_COMMUNITY)
Admission: EM | Admit: 2013-12-24 | Discharge: 2013-12-24 | Disposition: A | Discharge status: Home / Self Care | Payer: Medicaid Other | Attending: Emergency Medicine | Admitting: Emergency Medicine

## 2013-12-24 ENCOUNTER — Encounter (HOSPITAL_COMMUNITY): Payer: Self-pay | Admitting: Emergency Medicine

## 2013-12-24 DIAGNOSIS — Z79899 Other long term (current) drug therapy: Secondary | ICD-10-CM | POA: Insufficient documentation

## 2013-12-24 DIAGNOSIS — Z86011 Personal history of benign neoplasm of the brain: Secondary | ICD-10-CM | POA: Insufficient documentation

## 2013-12-24 DIAGNOSIS — F411 Generalized anxiety disorder: Secondary | ICD-10-CM | POA: Insufficient documentation

## 2013-12-24 DIAGNOSIS — T783XXA Angioneurotic edema, initial encounter: Secondary | ICD-10-CM | POA: Insufficient documentation

## 2013-12-24 DIAGNOSIS — Z8742 Personal history of other diseases of the female genital tract: Secondary | ICD-10-CM | POA: Insufficient documentation

## 2013-12-24 DIAGNOSIS — I10 Essential (primary) hypertension: Secondary | ICD-10-CM | POA: Insufficient documentation

## 2013-12-24 DIAGNOSIS — IMO0002 Reserved for concepts with insufficient information to code with codable children: Secondary | ICD-10-CM | POA: Insufficient documentation

## 2013-12-24 LAB — CBC WITH DIFFERENTIAL/PLATELET
BASOS PCT: 0 % (ref 0–1)
Basophils Absolute: 0 10*3/uL (ref 0.0–0.1)
EOS ABS: 0.1 10*3/uL (ref 0.0–0.7)
Eosinophils Relative: 2 % (ref 0–5)
HCT: 36.7 % (ref 36.0–46.0)
Hemoglobin: 12.5 g/dL (ref 12.0–15.0)
LYMPHS ABS: 1.6 10*3/uL (ref 0.7–4.0)
Lymphocytes Relative: 23 % (ref 12–46)
MCH: 29.1 pg (ref 26.0–34.0)
MCHC: 34.1 g/dL (ref 30.0–36.0)
MCV: 85.3 fL (ref 78.0–100.0)
Monocytes Absolute: 0.5 10*3/uL (ref 0.1–1.0)
Monocytes Relative: 7 % (ref 3–12)
NEUTROS ABS: 4.7 10*3/uL (ref 1.7–7.7)
NEUTROS PCT: 68 % (ref 43–77)
PLATELETS: 222 10*3/uL (ref 150–400)
RBC: 4.3 MIL/uL (ref 3.87–5.11)
RDW: 13.7 % (ref 11.5–15.5)
WBC: 6.9 10*3/uL (ref 4.0–10.5)

## 2013-12-24 LAB — BASIC METABOLIC PANEL
BUN: 17 mg/dL (ref 6–23)
CO2: 22 mEq/L (ref 19–32)
Calcium: 8.9 mg/dL (ref 8.4–10.5)
Chloride: 101 mEq/L (ref 96–112)
Creatinine, Ser: 0.93 mg/dL (ref 0.50–1.10)
GFR, EST AFRICAN AMERICAN: 88 mL/min — AB (ref >90)
GFR, EST NON AFRICAN AMERICAN: 76 mL/min — AB (ref >90)
Glucose, Bld: 101 mg/dL — ABNORMAL HIGH (ref 70–99)
POTASSIUM: 3.5 meq/L — AB (ref 3.7–5.3)
SODIUM: 138 meq/L (ref 137–147)

## 2013-12-24 LAB — I-STAT CG4 LACTIC ACID, ED: Lactic Acid, Venous: 1.28 mmol/L (ref 0.5–2.2)

## 2013-12-24 MED ORDER — FAMOTIDINE IN NACL 20-0.9 MG/50ML-% IV SOLN
20.0000 mg | Freq: Once | INTRAVENOUS | Status: AC
  Administered 2013-12-24: 20 mg via INTRAVENOUS
  Filled 2013-12-24: qty 50

## 2013-12-24 MED ORDER — PREDNISONE 20 MG PO TABS: ORAL_TABLET | ORAL | Status: DC

## 2013-12-24 MED ORDER — METHYLPREDNISOLONE SODIUM SUCC 125 MG IJ SOLR
125.0000 mg | Freq: Once | INTRAMUSCULAR | Status: AC
  Administered 2013-12-24: 125 mg via INTRAVENOUS
  Filled 2013-12-24: qty 2

## 2013-12-24 MED ORDER — EPINEPHRINE 0.3 MG/0.3ML IJ SOAJ
INTRAMUSCULAR | Status: AC
  Administered 2013-12-24: 0.3 mg via INTRAMUSCULAR
  Filled 2013-12-24: qty 0.3

## 2013-12-24 MED ORDER — EPINEPHRINE HCL 0.1 MG/ML IJ SOSY
0.3000 mg | PREFILLED_SYRINGE | Freq: Once | INTRAMUSCULAR | Status: DC
  Filled 2013-12-24: qty 10

## 2013-12-24 MED ORDER — EPINEPHRINE 0.3 MG/0.3ML IJ SOAJ: 0.3000 mg | INTRAMUSCULAR | Status: DC | PRN

## 2013-12-24 MED ORDER — DIPHENHYDRAMINE HCL 50 MG/ML IJ SOLN
25.0000 mg | Freq: Once | INTRAMUSCULAR | Status: AC
  Administered 2013-12-24: 25 mg via INTRAVENOUS
  Filled 2013-12-24: qty 1

## 2013-12-24 MED ORDER — EPINEPHRINE 0.3 MG/0.3ML IJ SOAJ: 0.3000 mg | Freq: Once | INTRAMUSCULAR | Status: AC

## 2013-12-24 NOTE — ED Notes (Signed)
Pt reports she began having hand/feet itching and used her epi-pen at that time, pt began experiencing increased oral/facial swelling.

## 2013-12-24 NOTE — ED Notes (Signed)
Pt reports improvement in symptoms - able to whisper words and has significantly decreased labored breathing. Pt ambulatory to restroom w/o difficulty.

## 2013-12-24 NOTE — ED Notes (Signed)
Pt up ambulatory to the bathroom at this time with no difficulty or distress 

## 2013-12-24 NOTE — Discharge Instructions (Signed)
Take prednisone as prescribed.   Take benadryl 25 mg every 8 hrs for 2 days then as needed.   Stop taking cabergoline.   Use epi pen if you have trouble breathing.   Follow up with your doctor.   Return to ER if you have trouble breathing, shortness of breath, worse swelling.

## 2013-12-24 NOTE — ED Notes (Signed)
Pt arrives to ED w/ signification oral swelling and hoarseness - no drooling noted on assessment - pt unable to speak well d/t edema, pt also tearful and gesturing towards her neck. Dr. Roxanne Mins at bedside for eval on arrival to room.

## 2013-12-24 NOTE — ED Notes (Signed)
Pt resting at this time, no needs

## 2013-12-24 NOTE — ED Notes (Signed)
Pt returned from using the bathroom; pt placed back on monitor, continuous pulse oximetry and blood pressure cuff; pt resting at this time

## 2013-12-24 NOTE — ED Provider Notes (Signed)
  Physical Exam  BP 108/53  Pulse 83  Temp(Src) 97.8 F (36.6 C) (Oral)  Resp 16  SpO2 99%  Physical Exam  ED Course  Procedures  Care assumed at sign out from Dr. Roxanne Mins. Patient here with angioedema. Had history of this before. Recently started on cabergoline a week ago. Given epi, observed for 5 hrs in the ED. Lip swelling improved. She states that her voice is getting better. Denies shortness of breath or trouble swallowing. Stable for d/c. Will d/c home with prednisone, benadryl, epi pen. Recommend stopping cabergoline.     Wandra Arthurs, MD 12/24/13 [phone]

## 2013-12-24 NOTE — ED Provider Notes (Addendum)
CSN: [id number]     Arrival date & time 12/24/13  0345 History   First MD Initiated Contact with Patient 12/24/13 [phone]     Chief Complaint  Patient presents with  . Angioedema     (Consider location/radiation/quality/duration/timing/severity/associated sxs/prior Treatment) The history is provided by the patient.   43 year old female noted some scratchiness in her throat at about 8 PM and this has gotten worse to the point where her lips are now severely swollen and she feels like her breathing is being shut off. She did use her EpiPen at home with slight relief, but symptoms started to get worse again. She has had similar reactions in the past with no clear precipitating allergen..  Past Medical History  Diagnosis Date  . Brain tumor     micro-pituitary adenoma  . Ovarian cyst    Past Surgical History  Procedure Laterality Date  . Eye surgery      removed eyelid cyst   No family history on file. History  Substance Use Topics  . Smoking status: Never Smoker   . Smokeless tobacco: Never Used  . Alcohol Use: Yes     Comment: socially   OB History   Grav Para Term Preterm Abortions TAB SAB Ect Mult Living   5 3 3  2  2   3      Review of Systems  Unable to perform ROS: Acuity of condition      Allergies  Review of patient's allergies indicates no known allergies.  Home Medications   Current Outpatient Rx  Name  Route  Sig  Dispense  Refill  . cabergoline (DOSTINEX) 0.5 MG tablet   Oral   Take 0.25 mg by mouth 2 (two) times a week.         . diphenhydrAMINE (BENADRYL) 25 MG tablet   Oral   Take 1 tablet (25 mg total) by mouth every 6 (six) hours.   20 tablet   0   . EPINEPHrine (EPIPEN) 0.3 mg/0.3 mL SOAJ injection   Intramuscular   Inject 0.3 mLs (0.3 mg total) into the muscle once.   1 Device   0   . famotidine (PEPCID) 20 MG tablet   Oral   Take 1 tablet (20 mg total) by mouth 2 (two) times daily.   30 tablet   0   . methocarbamol (ROBAXIN) 500  MG tablet   Oral   Take 1 tablet (500 mg total) by mouth 4 (four) times daily as needed.   15 tablet   0   . predniSONE (DELTASONE) 20 MG tablet   Oral   Take 3 tablets (60 mg total) by mouth daily.   15 tablet   0    BP 136/85  Pulse 87  Temp(Src) 97.8 F (36.6 C) (Oral)  Resp 15  SpO2 100% Physical Exam  Nursing note and vitals reviewed.  43 year old female, very anxious, but in no acute distress. Vital signs are normal. Oxygen saturation is 100%, which is normal. Head is normocephalic and atraumatic. PERRLA, EOMI. Oropharynx is clear. There is angioedema of the upper and lower lips but no edema of the tongue or uvula oropharynx. She is tolerating her secretions well but she is not able to speak. Neck is nontender and supple without adenopathy or JVD. There is no stridor. Back is nontender and there is no CVA tenderness. Lungs are clear without rales, wheezes, or rhonchi. Chest is nontender. Heart has regular rate and rhythm without murmur. Abdomen is  soft, flat, nontender without masses or hepatosplenomegaly and peristalsis is normoactive. Extremities have no cyanosis or edema, full range of motion is present. Skin is warm and dry without rash. Neurologic: Mental status is normal, cranial nerves are intact, there are no motor or sensory deficits.  ED Course  Procedures (including critical care time) Labs Review Results for orders placed during the hospital encounter of 12/24/13  CBC WITH DIFFERENTIAL      Result Value Ref Range   WBC 6.9  4.0 - 10.5 K/uL   RBC 4.30  3.87 - 5.11 MIL/uL   Hemoglobin 12.5  12.0 - 15.0 g/dL   HCT 36.7  36.0 - 46.0 %   MCV 85.3  78.0 - 100.0 fL   MCH 29.1  26.0 - 34.0 pg   MCHC 34.1  30.0 - 36.0 g/dL   RDW 13.7  11.5 - 15.5 %   Platelets 222  150 - 400 K/uL   Neutrophils Relative % 68  43 - 77 %   Neutro Abs 4.7  1.7 - 7.7 K/uL   Lymphocytes Relative 23  12 - 46 %   Lymphs Abs 1.6  0.7 - 4.0 K/uL   Monocytes Relative 7  3 - 12 %    Monocytes Absolute 0.5  0.1 - 1.0 K/uL   Eosinophils Relative 2  0 - 5 %   Eosinophils Absolute 0.1  0.0 - 0.7 K/uL   Basophils Relative 0  0 - 1 %   Basophils Absolute 0.0  0.0 - 0.1 K/uL  BASIC METABOLIC PANEL      Result Value Ref Range   Sodium 138  137 - 147 mEq/L   Potassium 3.5 (*) 3.7 - 5.3 mEq/L   Chloride 101  96 - 112 mEq/L   CO2 22  19 - 32 mEq/L   Glucose, Bld 101 (*) 70 - 99 mg/dL   BUN 17  6 - 23 mg/dL   Creatinine, Ser 0.93  0.50 - 1.10 mg/dL   Calcium 8.9  8.4 - 10.5 mg/dL   GFR calc non Af Amer 76 (*) >90 mL/min   GFR calc Af Amer 88 (*) >90 mL/min  I-STAT CG4 LACTIC ACID, ED      Result Value Ref Range   Lactic Acid, Venous 1.28  0.5 - 2.2 mmol/L   CRITICAL CARE Performed by: STMHD,[email or web address] Total critical care time: 75 minutes Critical care time was exclusive of separately billable procedures and treating other patients. Critical care was necessary to treat or prevent imminent or life-threatening deterioration. Critical care was time spent personally by me on the following activities: development of treatment plan with patient and/or surrogate as well as nursing, discussions with consultants, evaluation of patient's response to treatment, examination of patient, obtaining history from patient or surrogate, ordering and performing treatments and interventions, ordering and review of laboratory studies, ordering and review of radiographic studies, pulse oximetry and re-evaluation of patient's condition.  MDM   Final diagnoses:  Angioedema    Recurrent angioedema. Old records are reviewed and she was seen in September for similar presentation she's given additional epinephrine in the emergency department as well as diphenhydramine, famotidine, and methylprednisolone.  Following above noted medication, patient was sleeping comfortably. There was no stridor and she maintained good oxygen saturation. After several hours of observation, I woke the patient up. She states  she is now able to talk with her voice is somewhat coarse. Swelling appears to go on down slightly. It is elected to continue observing  her in the ED. Case to be turned over to Dr. Darl Householder.  Delora Fuel, MD 37/04/88 [id number]  Delora Fuel, MD 94/50/38 [id number]

## 2014-02-24 ENCOUNTER — Encounter (HOSPITAL_COMMUNITY): Payer: Self-pay | Admitting: Emergency Medicine

## 2014-02-24 DIAGNOSIS — IMO0002 Reserved for concepts with insufficient information to code with codable children: Secondary | ICD-10-CM | POA: Insufficient documentation

## 2014-02-24 DIAGNOSIS — M545 Low back pain, unspecified: Secondary | ICD-10-CM | POA: Insufficient documentation

## 2014-02-24 DIAGNOSIS — K645 Perianal venous thrombosis: Secondary | ICD-10-CM | POA: Insufficient documentation

## 2014-02-24 DIAGNOSIS — Z8742 Personal history of other diseases of the female genital tract: Secondary | ICD-10-CM | POA: Insufficient documentation

## 2014-02-24 DIAGNOSIS — Z79899 Other long term (current) drug therapy: Secondary | ICD-10-CM | POA: Insufficient documentation

## 2014-02-24 DIAGNOSIS — R609 Edema, unspecified: Secondary | ICD-10-CM | POA: Insufficient documentation

## 2014-02-24 DIAGNOSIS — Z86011 Personal history of benign neoplasm of the brain: Secondary | ICD-10-CM | POA: Insufficient documentation

## 2014-02-24 MED ORDER — OXYCODONE-ACETAMINOPHEN 5-325 MG PO TABS
1.0000 | ORAL_TABLET | Freq: Once | ORAL | Status: AC
  Administered 2014-02-24: 1 via ORAL
  Filled 2014-02-24: qty 1

## 2014-02-24 NOTE — ED Notes (Signed)
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 Also c/o hemrrhoid that is out and very painful. Reports bleeding that has stopped. The hemrhoid popped out last night and has been very painful since.

## 2014-02-25 ENCOUNTER — Emergency Department (HOSPITAL_COMMUNITY)
Admission: EM | Admit: 2014-02-25 | Discharge: 2014-02-25 | Disposition: A | Discharge status: Home / Self Care | Payer: Medicaid Other | Attending: Emergency Medicine | Admitting: Emergency Medicine

## 2014-02-25 DIAGNOSIS — K644 Residual hemorrhoidal skin tags: Secondary | ICD-10-CM

## 2014-02-25 DIAGNOSIS — K645 Perianal venous thrombosis: Secondary | ICD-10-CM

## 2014-02-25 DIAGNOSIS — M545 Low back pain, unspecified: Secondary | ICD-10-CM

## 2014-02-25 DIAGNOSIS — T7840XA Allergy, unspecified, initial encounter: Secondary | ICD-10-CM

## 2014-02-25 LAB — URINALYSIS, ROUTINE W REFLEX MICROSCOPIC
Bilirubin Urine: NEGATIVE
GLUCOSE, UA: NEGATIVE mg/dL
Hgb urine dipstick: NEGATIVE
KETONES UR: NEGATIVE mg/dL
LEUKOCYTES UA: NEGATIVE
Nitrite: NEGATIVE
PROTEIN: 30 mg/dL — AB
Specific Gravity, Urine: 1.026 (ref 1.005–1.030)
UROBILINOGEN UA: 1 mg/dL (ref 0.0–1.0)
pH: 6.5 (ref 5.0–8.0)

## 2014-02-25 LAB — CBC WITH DIFFERENTIAL/PLATELET
BASOS PCT: 0 % (ref 0–1)
Basophils Absolute: 0 10*3/uL (ref 0.0–0.1)
EOS ABS: 0.2 10*3/uL (ref 0.0–0.7)
EOS PCT: 4 % (ref 0–5)
HEMATOCRIT: 35.6 % — AB (ref 36.0–46.0)
Hemoglobin: 11.8 g/dL — ABNORMAL LOW (ref 12.0–15.0)
Lymphocytes Relative: 38 % (ref 12–46)
Lymphs Abs: 1.5 10*3/uL (ref 0.7–4.0)
MCH: 29.1 pg (ref 26.0–34.0)
MCHC: 33.1 g/dL (ref 30.0–36.0)
MCV: 87.7 fL (ref 78.0–100.0)
MONO ABS: 0.3 10*3/uL (ref 0.1–1.0)
Monocytes Relative: 8 % (ref 3–12)
Neutro Abs: 2 10*3/uL (ref 1.7–7.7)
Neutrophils Relative %: 50 % (ref 43–77)
Platelets: 170 10*3/uL (ref 150–400)
RBC: 4.06 MIL/uL (ref 3.87–5.11)
RDW: 13.5 % (ref 11.5–15.5)
WBC: 3.9 10*3/uL — ABNORMAL LOW (ref 4.0–10.5)

## 2014-02-25 LAB — BASIC METABOLIC PANEL
BUN: 14 mg/dL (ref 6–23)
CALCIUM: 8.6 mg/dL (ref 8.4–10.5)
CO2: 22 mEq/L (ref 19–32)
CREATININE: 0.92 mg/dL (ref 0.50–1.10)
Chloride: 105 mEq/L (ref 96–112)
GFR, EST AFRICAN AMERICAN: 89 mL/min — AB (ref >90)
GFR, EST NON AFRICAN AMERICAN: 77 mL/min — AB (ref >90)
Glucose, Bld: 97 mg/dL (ref 70–99)
Potassium: 3.6 mEq/L — ABNORMAL LOW (ref 3.7–5.3)
SODIUM: 142 meq/L (ref 137–147)

## 2014-02-25 LAB — URINE MICROSCOPIC-ADD ON

## 2014-02-25 MED ORDER — HYDROMORPHONE HCL PF 1 MG/ML IJ SOLN
1.0000 mg | Freq: Once | INTRAMUSCULAR | Status: AC
  Administered 2014-02-25: 1 mg via INTRAVENOUS
  Filled 2014-02-25: qty 1

## 2014-02-25 MED ORDER — LIDOCAINE HCL (PF) 1 % IJ SOLN
5.0000 mL | Freq: Once | INTRAMUSCULAR | Status: AC
  Administered 2014-02-25: 5 mL via INTRADERMAL
  Filled 2014-02-25: qty 5

## 2014-02-25 MED ORDER — KETOROLAC TROMETHAMINE 30 MG/ML IJ SOLN
30.0000 mg | Freq: Once | INTRAMUSCULAR | Status: AC
  Administered 2014-02-25: 30 mg via INTRAVENOUS
  Filled 2014-02-25: qty 1

## 2014-02-25 MED ORDER — METHYLPREDNISOLONE SODIUM SUCC 125 MG IJ SOLR
80.0000 mg | Freq: Once | INTRAMUSCULAR | Status: AC
  Administered 2014-02-25: 80 mg via INTRAVENOUS
  Filled 2014-02-25: qty 2

## 2014-02-25 MED ORDER — NAPROXEN 375 MG PO TABS: 375.0000 mg | ORAL_TABLET | Freq: Two times a day (BID) | ORAL | Status: DC

## 2014-02-25 MED ORDER — HYDROCORTISONE ACETATE 25 MG RE SUPP: 25.0000 mg | Freq: Two times a day (BID) | RECTAL | Status: DC

## 2014-02-25 MED ORDER — HYDROCODONE-ACETAMINOPHEN 5-325 MG PO TABS: 1.0000 | ORAL_TABLET | ORAL | Status: DC | PRN

## 2014-02-25 MED ORDER — SODIUM CHLORIDE 0.9 % IV BOLUS (SEPSIS)
500.0000 mL | Freq: Once | INTRAVENOUS | Status: AC
  Administered 2014-02-25: 500 mL via INTRAVENOUS

## 2014-02-25 MED ORDER — HYDROCODONE-ACETAMINOPHEN 5-325 MG PO TABS
2.0000 | ORAL_TABLET | Freq: Once | ORAL | Status: AC
  Administered 2014-02-25: 2 via ORAL
  Filled 2014-02-25: qty 2

## 2014-02-25 NOTE — ED Notes (Signed)
Dr. Reather Converse at [facility] for lidocaine injection and I&D of hemorroid, pt tolerated well, VSS.  Surgery to see.

## 2014-02-25 NOTE — Discharge Instructions (Signed)
Use sitz baths 2-3 times a day. Use medicines as directed and use ice for 10 minutes at a time for swelling with a cloth over top. Followup with general surgery as directed.  If you were given medicines take as directed.  If you are on coumadin or contraceptives realize their levels and effectiveness is altered by many different medicines.  If you have any reaction (rash, tongues swelling, other) to the medicines stop taking and see a physician.   Please follow up as directed and return to the ER or see a physician for new or worsening symptoms.  Thank you. Filed Vitals:   02/25/14 0345 02/25/14 0400 02/25/14 0415 02/25/14 0430  BP: 103/68 120/64 121/70 113/74  Pulse: 68 66 66 64  Temp:      TempSrc:      Resp:      SpO2: 100% 100% 99% 100%

## 2014-02-25 NOTE — ED Notes (Signed)
Pt sleepy. Does not have a way home. Lives alone. No family or friends. Needs work note.

## 2014-02-25 NOTE — ED Notes (Signed)
Ice pack given and explained with rationale. Alert, NAD, calm, VSS.

## 2014-02-25 NOTE — ED Notes (Signed)
PT dropped to her knees in  triage waiting room

## 2014-02-25 NOTE — ED Notes (Signed)
Dr. Grandville Silos (Surgery) at [facility], pt resting/ sleeping, NAD, calm, arousable, interactive, prone.

## 2014-02-25 NOTE — Consult Note (Signed)
Reason for Consult:External hemorrhoid Referring Physician: Sameeha Rockefeller is an 43 y.o. female.  HPI: Patient has a history of hemorrhoids for 5 years. Today she developed much more significant pain. She denies leaning. She was seen in the emergency department where the EDP incised a large external hemorrhoid. He reports not much clot came out. He asked [place] to see it as well for other recommendations.  Past Medical History  Diagnosis Date  . Brain tumor     micro-pituitary adenoma  . Ovarian cyst     Past Surgical History  Procedure Laterality Date  . Eye surgery      removed eyelid cyst    History reviewed. No pertinent family history.  Social History:  reports that she has never smoked. She has never used smokeless tobacco. She reports that she drinks alcohol. She reports that she does not use illicit drugs.  Allergies: No Known Allergies  Medications: Prior to Admission:  (Not in a hospital admission)  Results for orders placed during the hospital encounter of 02/25/14 (from the past 48 hour(s))  CBC WITH DIFFERENTIAL     Status: Abnormal   Collection Time    02/25/14  1:33 AM      Result Value Ref Range   WBC 3.9 (*) 4.0 - 10.5 K/uL   RBC 4.06  3.87 - 5.11 MIL/uL   Hemoglobin 11.8 (*) 12.0 - 15.0 g/dL   HCT 35.6 (*) 36.0 - 46.0 %   MCV 87.7  78.0 - 100.0 fL   MCH 29.1  26.0 - 34.0 pg   MCHC 33.1  30.0 - 36.0 g/dL   RDW 13.5  11.5 - 15.5 %   Platelets 170  150 - 400 K/uL   Neutrophils Relative % 50  43 - 77 %   Neutro Abs 2.0  1.7 - 7.7 K/uL   Lymphocytes Relative 38  12 - 46 %   Lymphs Abs 1.5  0.7 - 4.0 K/uL   Monocytes Relative 8  3 - 12 %   Monocytes Absolute 0.3  0.1 - 1.0 K/uL   Eosinophils Relative 4  0 - 5 %   Eosinophils Absolute 0.2  0.0 - 0.7 K/uL   Basophils Relative 0  0 - 1 %   Basophils Absolute 0.0  0.0 - 0.1 K/uL  BASIC METABOLIC PANEL     Status: Abnormal   Collection Time    02/25/14  1:33 AM      Result Value Ref Range   Sodium 142  137 - 147 mEq/L   Potassium 3.6 (*) 3.7 - 5.3 mEq/L   Chloride 105  96 - 112 mEq/L   CO2 22  19 - 32 mEq/L   Glucose, Bld 97  70 - 99 mg/dL   BUN 14  6 - 23 mg/dL   Creatinine, Ser 0.92  0.50 - 1.10 mg/dL   Calcium 8.6  8.4 - 10.5 mg/dL   GFR calc non Af Amer 77 (*) >90 mL/min   GFR calc Af Amer 89 (*) >90 mL/min   Comment: (NOTE)     The eGFR has been calculated using the CKD EPI equation.     This calculation has not been validated in all clinical situations.     eGFR's persistently <90 mL/min signify possible Chronic Kidney     Disease.  URINALYSIS, ROUTINE W REFLEX MICROSCOPIC     Status: Abnormal   Collection Time    02/25/14  2:39 AM  Result Value Ref Range   Color, Urine YELLOW  YELLOW   APPearance CLEAR  CLEAR   Specific Gravity, Urine 1.026  1.005 - 1.030   pH 6.5  5.0 - 8.0   Glucose, UA NEGATIVE  NEGATIVE mg/dL   Hgb urine dipstick NEGATIVE  NEGATIVE   Bilirubin Urine NEGATIVE  NEGATIVE   Ketones, ur NEGATIVE  NEGATIVE mg/dL   Protein, ur 30 (*) NEGATIVE mg/dL   Urobilinogen, UA 1.0  0.0 - 1.0 mg/dL   Nitrite NEGATIVE  NEGATIVE   Leukocytes, UA NEGATIVE  NEGATIVE  URINE MICROSCOPIC-ADD ON     Status: Abnormal   Collection Time    02/25/14  2:39 AM      Result Value Ref Range   Squamous Epithelial / LPF FEW (*) RARE   RBC / HPF 0-2  <3 RBC/hpf   Bacteria, UA FEW (*) RARE    No results found.  Review of Systems  Unable to perform ROS: other   Blood pressure 103/68, pulse 68, temperature 97.6 F (36.4 C), temperature source Oral, resp. rate 22, last menstrual period 02/14/2014, SpO2 100.00%. Physical Exam  Constitutional: She appears well-developed and well-nourished.  HENT:  Head: Normocephalic.  Cardiovascular: Normal rate.   Respiratory: Effort normal and breath sounds normal. No respiratory distress.  GI: Soft. There is no tenderness.  Genitourinary:  2cm external hemorrhoid, incision in place, no visualized residual clot, no evidence  of perirectal abscess    Assessment/Plan: Inflamed external hemorrhoid without residual thrombosis status post incision by EDP. Acutely, recommend an ice pack which should help decrease pain and inflammation. Also, apply Anusol TID. Continue ice pack at home PRN for the next day or two.  She may call our office and make an appointment with Dr. Leighton Ruff, our colorectal specialist.  Zenovia Jarred 02/25/2014, 4:08 AM

## 2014-02-25 NOTE — ED Notes (Signed)
Patient brought to exam room, crying in pain.  She also has noted new onset of swelling to her upper lip.  She has hx of same.  Patient with complaints of back pain and rectal pain.  ERMD to bedside.  Unable to fully examine rectum due to hermorrhoid and pain.

## 2014-02-25 NOTE — ED Provider Notes (Signed)
CSN: [id number]     Arrival date & time 02/24/14  1922 History   First MD Initiated Contact with Patient 02/25/14 0131     Chief Complaint  Patient presents with  . Hemorrhoids  . Back Pain     (Consider location/radiation/quality/duration/timing/severity/associated sxs/prior Treatment) HPI Comments: 43 year old female with history of internal hemorrhoids presents with severe rectal pain and mild lower back pain with movement. Patient has had with the symptoms the past however the rectal pain became very severe since earlier today. No bleeding noticed. No fevers or chills or known abscess history. No GI history known. Pain to palpation and stooling. No urinary symptoms. No abdominal pain or vomiting.  Patient is a 43 y.o. female presenting with back pain. The history is provided by the patient.  Back Pain Associated symptoms: no abdominal pain, no chest pain, no dysuria, no fever, no headaches and no pelvic pain     Past Medical History  Diagnosis Date  . Brain tumor     micro-pituitary adenoma  . Ovarian cyst    Past Surgical History  Procedure Laterality Date  . Eye surgery      removed eyelid cyst   History reviewed. No pertinent family history. History  Substance Use Topics  . Smoking status: Never Smoker   . Smokeless tobacco: Never Used  . Alcohol Use: Yes     Comment: socially   OB History   Grav Para Term Preterm Abortions TAB SAB Ect Mult Living   5 3 3  2  2   3      Review of Systems  Constitutional: Negative for fever and chills.  HENT: Negative for congestion.   Eyes: Negative for visual disturbance.  Respiratory: Negative for shortness of breath.   Cardiovascular: Negative for chest pain.  Gastrointestinal: Negative for vomiting and abdominal pain.  Genitourinary: Negative for dysuria, flank pain and pelvic pain.  Musculoskeletal: Positive for back pain. Negative for neck pain and neck stiffness.  Skin: Negative for rash.  Neurological: Negative for  light-headedness and headaches.      Allergies  Review of patient's allergies indicates no known allergies.  Home Medications   Prior to Admission medications   Medication Sig Start Date End Date Taking? Authorizing Provider  cabergoline (DOSTINEX) 0.5 MG tablet Take 0.25 mg by mouth 2 (two) times a week.    Historical Provider, MD  EPINEPHrine (EPIPEN) 0.3 mg/0.3 mL SOAJ injection Inject 0.3 mLs (0.3 mg total) into the muscle once. 06/20/13   Kalman Drape, MD  EPINEPHrine (EPIPEN) 0.3 mg/0.3 mL SOAJ injection Inject 0.3 mLs (0.3 mg total) into the muscle as needed. 12/24/13   Wandra Arthurs, MD  methocarbamol (ROBAXIN) 500 MG tablet Take 1 tablet (500 mg total) by mouth 4 (four) times daily as needed. 01/20/13   Clayton Bibles, PA-C  predniSONE (DELTASONE) 20 MG tablet Take 60 mg daily for 2 days then 40 mg daily for 2 days then 20 mg daily for 2 days then stop 12/24/13   Wandra Arthurs, MD   BP 139/89  Pulse 73  Temp(Src) 97.6 F (36.4 C) (Oral)  Resp 22  SpO2 98%  LMP 02/14/2014 Physical Exam  Nursing note and vitals reviewed. Constitutional: She is oriented to person, place, and time. She appears well-developed and well-nourished.  HENT:  Head: Normocephalic and atraumatic.  Patient has edema to upper lip and no signs of angioedema posterior pharynx, neck supple, no breathing difficulties  Eyes: Conjunctivae are normal. Right eye exhibits no  discharge. Left eye exhibits no discharge.  Neck: Normal range of motion. Neck supple. No tracheal deviation present.  Cardiovascular: Normal rate and regular rhythm.   Pulmonary/Chest: Effort normal and breath sounds normal.  Abdominal: Soft. She exhibits no distension. There is no tenderness. There is no guarding.  Genitourinary:  Patient has 2 cm swollen/thrombosed external hemorrhoid it is very tender to palpation. No active bleeding. Difficult rectal exam due to pain externally. No signs of external abscess.  Musculoskeletal: She exhibits no  edema.  Neurological: She is alert and oriented to person, place, and time.  Skin: Skin is warm. No rash noted.  Psychiatric: She has a normal mood and affect.    ED Course  Procedures (including critical care time) Incision and drainage of external thrombosed hemorrhoid. Risks and benefits discussed. Large external hemorrhoid cleanse with Betadine and numbed with 1% lidocaine. Incision made 1 cm in length and open with forceps. No large clot visualized. Mild bleeding which is controlled.  Labs Review Labs Reviewed  CBC WITH DIFFERENTIAL - Abnormal; Notable for the following:    WBC 3.9 (*)    Hemoglobin 11.8 (*)    HCT 35.6 (*)    All other components within normal limits  BASIC METABOLIC PANEL - Abnormal; Notable for the following:    Potassium 3.6 (*)    GFR calc non Af Amer 77 (*)    GFR calc Af Amer 89 (*)    All other components within normal limits  URINALYSIS, ROUTINE W REFLEX MICROSCOPIC - Abnormal; Notable for the following:    Protein, ur 30 (*)    All other components within normal limits  URINE MICROSCOPIC-ADD ON - Abnormal; Notable for the following:    Squamous Epithelial / LPF FEW (*)    Bacteria, UA FEW (*)    All other components within normal limits    Imaging Review No results found.   EKG Interpretation None      MDM   Final diagnoses:  Thrombosed external hemorrhoid  Lower back pain  Allergic reaction   Patient with clinically external thrombosed tender hemorrhoid. Patient having very significant pain ED plan for pain medicines, basic blood work and likely excision of thrombosed hemorrhoid due to the extreme pain. Difficulty with internal exam due to pain is unable to check for internal concerns at this time. Plan for recheck and decide if patient require CT scan for further detail.  In triage patient was given Percocet and has had lip swelling gradually worsening since. Patient has epi   EpiPen history of similar facial swelling however no  diagnoses of cholecystectomy per patient. No other new exposures known. Plan for Solu-Medrol and close observation.   Patient's swelling improved with observation and steroids. External hemorrhoid lanced however no significant clot seen.  The extent of pain consult to Gen. surgery for device who agreed with conservative treatment and  him recommended ice pack for an hour prior to discharge. Arrange outpatient followup with Dr. Marcello Moores.   Patient's pain improved in the ER. Plan for observation until patient more alert after narcotics.  Results and differential diagnosis were discussed with the patient/parent/guardian. Close follow up outpatient was discussed, comfortable with the plan.   Filed Vitals:   02/25/14 0500 02/25/14 0515 02/25/14 0530 02/25/14 0545  BP: 113/59 117/70 111/72 110/65  Pulse: 67 65 60 59  Temp:      TempSrc:      Resp:      SpO2: 99% 100% 100% 100%  Mariea Clonts, MD 02/25/14 [phone]

## 2014-02-25 NOTE — ED Notes (Addendum)
Pt alert, NAD, restless/ writhing in pain, pinpoints pain to rectal area, anxious, crying, congested, interactive, no dyspnea noted, IVF and pain meds given. Oral/lip swelling noted. Pt blowing nose. "Does not like the way dilaudid made her feel (initially)".

## 2014-02-25 NOTE — ED Notes (Signed)
Pt sleeping, NAD, calm.  Suture cart, I&D tray, lidocaine remain at [facility].

## 2014-02-25 NOTE — ED Notes (Signed)
Pt still drowsy, but easily arousable, trying to contact family member to pick her up.

## 2014-03-01 ENCOUNTER — Other Ambulatory Visit (INDEPENDENT_AMBULATORY_CARE_PROVIDER_SITE_OTHER): Payer: Self-pay | Admitting: General Surgery

## 2014-03-01 ENCOUNTER — Encounter (INDEPENDENT_AMBULATORY_CARE_PROVIDER_SITE_OTHER): Payer: Self-pay | Admitting: General Surgery

## 2014-03-01 ENCOUNTER — Ambulatory Visit (INDEPENDENT_AMBULATORY_CARE_PROVIDER_SITE_OTHER): Payer: Medicaid Other | Admitting: General Surgery

## 2014-03-01 ENCOUNTER — Emergency Department (HOSPITAL_COMMUNITY)
Admission: EM | Admit: 2014-03-01 | Discharge: 2014-03-01 | Disposition: A | Discharge status: Home / Self Care | Payer: Medicaid Other | Attending: Emergency Medicine | Admitting: Emergency Medicine

## 2014-03-01 ENCOUNTER — Emergency Department (HOSPITAL_COMMUNITY): Payer: Medicaid Other

## 2014-03-01 ENCOUNTER — Encounter (HOSPITAL_COMMUNITY): Payer: Self-pay | Admitting: Emergency Medicine

## 2014-03-01 VITALS — BP 140/76 | HR 83 | Temp 98.3°F | Ht 65.0 in | Wt 223.0 lb

## 2014-03-01 DIAGNOSIS — K6289 Other specified diseases of anus and rectum: Secondary | ICD-10-CM

## 2014-03-01 DIAGNOSIS — Z79899 Other long term (current) drug therapy: Secondary | ICD-10-CM | POA: Insufficient documentation

## 2014-03-01 DIAGNOSIS — Z791 Long term (current) use of non-steroidal anti-inflammatories (NSAID): Secondary | ICD-10-CM | POA: Insufficient documentation

## 2014-03-01 LAB — PREGNANCY, URINE: Preg Test, Ur: NEGATIVE

## 2014-03-01 MED ORDER — IOHEXOL 300 MG/ML  SOLN
100.0000 mL | Freq: Once | INTRAMUSCULAR | Status: AC | PRN
  Administered 2014-03-01: 100 mL via INTRAVENOUS

## 2014-03-01 MED ORDER — SODIUM CHLORIDE 0.9 % IV BOLUS (SEPSIS)
1000.0000 mL | Freq: Once | INTRAVENOUS | Status: AC
  Administered 2014-03-01: 1000 mL via INTRAVENOUS

## 2014-03-01 MED ORDER — TRAMADOL HCL 50 MG PO TABS: 50.0000 mg | ORAL_TABLET | Freq: Four times a day (QID) | ORAL | Status: DC | PRN

## 2014-03-01 NOTE — Progress Notes (Signed)
Subjective:     Patient ID: Jordan Mcbride, female   DOB: 02/19/1971, 43 y.o.   MRN: [id number]  HPI The patient is a 43 year old female who is referred here from [facility] ER Secondary to a hemorrhoid, possibly thrombosed.  Patient was seen approximately 6 days ago secondary to this. She states she has a prolapsed hemorrhoid that has some bleeding, and his pain. She states that once she self reduces it did bleeding and pain gets better. Patient was given Anusol suppositories prior she states she did not take these.  Review of Systems  Constitutional: Negative.   HENT: Negative.   Respiratory: Negative.   Cardiovascular: Negative.   Gastrointestinal: Negative.   Neurological: Negative.   All other systems reviewed and are negative.      Objective:   Physical Exam  Constitutional: She is oriented to person, place, and time. She appears well-developed and well-nourished.  HENT:  Head: Normocephalic and atraumatic.  Eyes: Conjunctivae and EOM are normal. Pupils are equal, round, and reactive to light.  Neck: Normal range of motion. Neck supple.  Cardiovascular: Normal rate, regular rhythm and normal heart sounds.   Pulmonary/Chest: Effort normal and breath sounds normal.  Abdominal: Soft. Bowel sounds are normal.  Genitourinary:     Musculoskeletal: Normal range of motion.  Neurological: She is alert and oriented to person, place, and time.  Skin: Skin is warm and dry.  Psychiatric: She has a normal mood and affect.       Assessment:     43 year old female with internal hemorrhoids, possible perirectal abscess.  There is no prolapsed hemorrhoid, the thrombosed hemorrhoid, however very tender to touch..    Plan:     1. The patient follow up in the ER for a CT scan of the abdomen and pelvis to evaluate for possible perirectal abscess. 2.  There is no perirectal abscess I would encourage patient to use her hydrocortisone suppositories to help with inflammation from likely an  internal hemorrhoid.

## 2014-03-01 NOTE — Discharge Instructions (Signed)
Take motrin or aleve as need for pain. You may also take ultram as need for pain - no driving when taking.  Your ct scan was read as showing no evidence of perirectal abscess.  The reading as follows: ---------------------------------------------------- IMPRESSION: Negative for perirectal or intrapelvic abscess. No acute abnormality.  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  Small fat containing umbilical hernia.  Endplate spur causes severe left and moderate right foraminal narrowing at L5-S1. ----------------------------------------------------  Follow up with your doctor/surgeon in the coming week for recheck. Return to ER if worse, new symptoms, fevers, abdominal pain, other concern.

## 2014-03-01 NOTE — ED Notes (Signed)
Pt c/o rectal pain since 5/28.  Sent here by doctor for CT d/t concern for perirectal abscess.

## 2014-03-01 NOTE — ED Provider Notes (Signed)
CSN: [id number]     Arrival date & time 03/01/14  1633 History   First MD Initiated Contact with Patient 03/01/14 1747     Chief Complaint  Patient presents with  . Rectal Pain     (Consider location/radiation/quality/duration/timing/severity/associated sxs/prior Treatment) The history is provided by the patient.  pt c/o rectal pain since 5/28. Constant, dull, moderate, worse w bm.  Was seen by gen surgery today and sent for CT.  Pt denies any acute or abrupt change in her pain today. Denies fever or chills. ?recent dx internal hemorrhoid, but given persistent pain was sent for CT.  Pt denies any anterior/abd/pelvis pain. No dysuria. Had small bm today.  lnmp 5/20. Denies vaginal discharge or bleeding. Denies rectal injury or trauma.     Past Medical History  Diagnosis Date  . Brain tumor     micro-pituitary adenoma  . Ovarian cyst    Past Surgical History  Procedure Laterality Date  . Eye surgery      removed eyelid cyst   No family history on file. History  Substance Use Topics  . Smoking status: Never Smoker   . Smokeless tobacco: Never Used  . Alcohol Use: Yes     Comment: socially   OB History   Grav Para Term Preterm Abortions TAB SAB Ect Mult Living   5 3 3  2  2   3      Review of Systems  Constitutional: Negative for fever and chills.  HENT: Negative for sore throat.   Eyes: Negative for redness.  Respiratory: Negative for shortness of breath.   Cardiovascular: Negative for chest pain.  Gastrointestinal: Negative for vomiting, abdominal pain, diarrhea and constipation.  Genitourinary: Negative for dysuria, flank pain, vaginal bleeding, vaginal discharge and pelvic pain.  Musculoskeletal: Negative for back pain and neck pain.  Skin: Negative for rash.  Neurological: Negative for headaches.  Hematological: Does not bruise/bleed easily.  Psychiatric/Behavioral: Negative for confusion.      Allergies  Review of patient's allergies indicates no known  allergies.  Home Medications   Prior to Admission medications   Medication Sig Start Date End Date Taking? Authorizing Provider  cabergoline (DOSTINEX) 0.5 MG tablet Take 0.25 mg by mouth 2 (two) times a week.   Yes Historical Provider, MD  EPINEPHrine (EPIPEN) 0.3 mg/0.3 mL SOAJ injection Inject 0.3 mLs (0.3 mg total) into the muscle as needed. 12/24/13  Yes Wandra Arthurs, MD  HYDROcodone-acetaminophen (NORCO) 5-325 MG per tablet Take 1-2 tablets by mouth every 4 (four) hours as needed. 02/25/14  Yes Mariea Clonts, MD  naproxen (NAPROSYN) 375 MG tablet Take 1 tablet (375 mg total) by mouth 2 (two) times daily. 02/25/14  Yes Mariea Clonts, MD  Prenatal Vit-Fe Fumarate-FA (PRENATAL MULTIVITAMIN) TABS tablet Take 1 tablet by mouth daily at 12 noon.   Yes Historical Provider, MD  hydrocortisone (ANUSOL-HC) 25 MG suppository Place 1 suppository (25 mg total) rectally 2 (two) times daily. For 7 days 02/25/14   Mariea Clonts, MD   BP 128/79  Pulse 73  Temp(Src) 98.1 F (36.7 C) (Oral)  Resp 18  SpO2 100%  LMP 02/14/2014 Physical Exam  Nursing note and vitals reviewed. Constitutional: She appears well-developed and well-nourished. No distress.  HENT:  Head: Atraumatic.  Eyes: Conjunctivae are normal. No scleral icterus.  Neck: Neck supple. No tracheal deviation present.  Cardiovascular: Normal rate, regular rhythm, normal heart sounds and intact distal pulses.   Pulmonary/Chest: Effort normal and breath sounds normal.  No respiratory distress.  Abdominal: Soft. Normal appearance and bowel sounds are normal. She exhibits no distension. There is no tenderness.  Genitourinary:  Normal external exam. No sts noted. No area of induration or fluctuance. No skin changes or erythema. Pt refuses/does not tolerate attempt at internal/rectal exam.   Musculoskeletal: She exhibits no edema.  Neurological: She is alert.  Skin: Skin is warm and dry. No rash noted.  Psychiatric:  anxious    ED Course   Procedures (including critical care time) Labs Review  Results for orders placed during the hospital encounter of 03/01/14  PREGNANCY, URINE      Result Value Ref Range   Preg Test, Ur NEGATIVE  NEGATIVE   Ct Pelvis W Contrast  03/01/2014   CLINICAL DATA:  Rectal bleeding. History of hemorrhoids. Question abscess.  EXAM: CT PELVIS WITH CONTRAST  TECHNIQUE: Multidetector CT imaging of the pelvis was performed using the standard protocol following the bolus administration of intravenous contrast.  CONTRAST:  100 mL OMNIPAQUE IOHEXOL 300 MG/ML  SOLN  COMPARISON:  None.  FINDINGS: There is no perirectal or intrapelvic abscess. 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 The patient has a small fat containing umbilical hernia. Visualized portions of the liver, gallbladder, left kidney and pancreas are unremarkable. Uterus, adnexa and urinary bladder appear normal. There is a tiny amount of free pelvic compatible with physiologic change. No focal bony abnormality is seen. Endplate spurring at M0-L4 causes severe left and moderate right foraminal narrowing.  IMPRESSION: Negative for perirectal or intrapelvic abscess. No acute abnormality.  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  Small fat containing umbilical hernia.  Endplate spur causes severe left and moderate right foraminal narrowing at L5-S1.   Electronically Signed   By: Inge Rise M.D.   On: 03/01/2014 19:32      MDM  Iv ns. Reviewed recent cbc and chemistry.    gen surg requests ct r/o perirectal abscess - they had seen in office today and sent to ED.  Reviewed nursing notes and prior charts for additional history.   Recheck pt comfortable. Nad. abd soft nt.  Appears stable for d/c.      Mirna Mires, MD 03/01/14 2024

## 2014-04-13 ENCOUNTER — Encounter (INDEPENDENT_AMBULATORY_CARE_PROVIDER_SITE_OTHER): Payer: Medicaid Other | Admitting: General Surgery

## 2014-05-04 ENCOUNTER — Ambulatory Visit (INDEPENDENT_AMBULATORY_CARE_PROVIDER_SITE_OTHER): Payer: Medicaid Other | Admitting: General Surgery

## 2014-05-04 ENCOUNTER — Encounter (INDEPENDENT_AMBULATORY_CARE_PROVIDER_SITE_OTHER): Payer: Self-pay | Admitting: General Surgery

## 2014-05-04 VITALS — BP 120/76 | HR 70 | Resp 16 | Ht 61.0 in | Wt 220.6 lb

## 2014-05-04 DIAGNOSIS — K648 Other hemorrhoids: Secondary | ICD-10-CM

## 2014-05-04 DIAGNOSIS — K649 Unspecified hemorrhoids: Secondary | ICD-10-CM

## 2014-05-04 NOTE — Progress Notes (Signed)
Patient ID: Jordan Mcbride, female   DOB: 1971/05/01, 43 y.o.   MRN: [id number]  No chief complaint on file.   HPI Jordan Mcbride is a 43 y.o. female.  The patient is a 43 year old female who apparently was seen secondary to external and internal hemorrhoids. Patient was sent to the ER secondary to possible perirectal abscess. CT scan revealed there was no abscess. Patient was given Anusol suppositories and since this helped after taking them. Patient continues to have issues with the internal/external hemorrhoid that prolapses. She stated that upon her addition a hemorrhoid chest pain relief.\  HPI  Past Medical History  Diagnosis Date  . Brain tumor     micro-pituitary adenoma  . Ovarian cyst     Past Surgical History  Procedure Laterality Date  . Eye surgery      removed eyelid cyst    No family history on file.  Social History History  Substance Use Topics  . Smoking status: Never Smoker   . Smokeless tobacco: Never Used  . Alcohol Use: Yes     Comment: socially    No Known Allergies  Current Outpatient Prescriptions  Medication Sig Dispense Refill  . cabergoline (DOSTINEX) 0.5 MG tablet Take 0.25 mg by mouth 2 (two) times a week.      Jordan Mcbride EPINEPHrine (EPIPEN) 0.3 mg/0.3 mL SOAJ injection Inject 0.3 mLs (0.3 mg total) into the muscle as needed.  2 Device  0  . HYDROcodone-acetaminophen (NORCO) 5-325 MG per tablet Take 1-2 tablets by mouth every 4 (four) hours as needed.  10 tablet  0  . hydrocortisone (ANUSOL-HC) 25 MG suppository Place 1 suppository (25 mg total) rectally 2 (two) times daily. For 7 days  14 suppository  0  . naproxen (NAPROSYN) 375 MG tablet Take 1 tablet (375 mg total) by mouth 2 (two) times daily.  20 tablet  0  . Prenatal Vit-Fe Fumarate-FA (PRENATAL MULTIVITAMIN) TABS tablet Take 1 tablet by mouth daily at 12 noon.       No current facility-administered medications for this visit.    Review of Systems Review of Systems  Constitutional:  Negative.   HENT: Negative.   Respiratory: Negative.   Cardiovascular: Negative.   Gastrointestinal: Negative.   Neurological: Negative.   All other systems reviewed and are negative.   Blood pressure 120/76, pulse 70, resp. rate 16, height 5\' 1"  (1.549 m), weight 220 lb 9.6 oz (100.064 kg).  Physical Exam Physical Exam  Constitutional: She is oriented to person, place, and time. She appears well-developed and well-nourished.  HENT:  Head: Normocephalic and atraumatic.  Eyes: Conjunctivae and EOM are normal. Pupils are equal, round, and reactive to light.  Neck: Normal range of motion. Neck supple.  Cardiovascular: Normal rate, regular rhythm and normal heart sounds.   Pulmonary/Chest: Effort normal and breath sounds normal.  Abdominal: Soft. Bowel sounds are normal.  Genitourinary:     Musculoskeletal: Normal range of motion.  Neurological: She is alert and oriented to person, place, and time.  Skin: Skin is warm and dry.  Psychiatric: She has a normal mood and affect.    Data Reviewed none  Assessment    43 year old female with external/internal prolapsing hemorrhoid.     Plan    1. We'll proceed to the operating room for an exam under anesthesia and excision of hemorrhoid. 2. I discussed risks and benefits of the procedure to include but not limited to infection, bleeding, damage to surrounding structures, possible incontinence, possible need for  further surgery. Patient was understanding and wished to proceed.        Rosario Jacks., Esraa Seres 05/04/2014, 12:31 PM

## 2014-05-26 ENCOUNTER — Telehealth (INDEPENDENT_AMBULATORY_CARE_PROVIDER_SITE_OTHER): Payer: Self-pay

## 2014-05-26 NOTE — Telephone Encounter (Signed)
Pt returning call to surgery schedulers. Pt advised msg will be forwarded with pts phone # [phone].

## 2014-06-07 ENCOUNTER — Other Ambulatory Visit (INDEPENDENT_AMBULATORY_CARE_PROVIDER_SITE_OTHER): Payer: Self-pay | Admitting: General Surgery

## 2014-06-12 ENCOUNTER — Encounter (HOSPITAL_COMMUNITY): Payer: Self-pay | Admitting: Emergency Medicine

## 2014-06-12 ENCOUNTER — Emergency Department (HOSPITAL_COMMUNITY)
Admission: EM | Admit: 2014-06-12 | Discharge: 2014-06-12 | Disposition: A | Discharge status: Home / Self Care | Payer: Medicaid Other | Attending: Emergency Medicine | Admitting: Emergency Medicine

## 2014-06-12 DIAGNOSIS — Z791 Long term (current) use of non-steroidal anti-inflammatories (NSAID): Secondary | ICD-10-CM | POA: Insufficient documentation

## 2014-06-12 DIAGNOSIS — IMO0002 Reserved for concepts with insufficient information to code with codable children: Secondary | ICD-10-CM | POA: Diagnosis not present

## 2014-06-12 DIAGNOSIS — Z79899 Other long term (current) drug therapy: Secondary | ICD-10-CM | POA: Insufficient documentation

## 2014-06-12 DIAGNOSIS — K6289 Other specified diseases of anus and rectum: Secondary | ICD-10-CM | POA: Diagnosis present

## 2014-06-12 DIAGNOSIS — Z86011 Personal history of benign neoplasm of the brain: Secondary | ICD-10-CM | POA: Insufficient documentation

## 2014-06-12 DIAGNOSIS — Z8742 Personal history of other diseases of the female genital tract: Secondary | ICD-10-CM | POA: Diagnosis not present

## 2014-06-12 DIAGNOSIS — Z9889 Other specified postprocedural states: Secondary | ICD-10-CM | POA: Diagnosis not present

## 2014-06-12 DIAGNOSIS — K645 Perianal venous thrombosis: Secondary | ICD-10-CM | POA: Diagnosis not present

## 2014-06-12 MED ORDER — HYDROCORTISONE ACETATE 25 MG RE SUPP
25.0000 mg | Freq: Two times a day (BID) | RECTAL | Status: DC
Start: 2014-06-12 — End: 2016-12-12

## 2014-06-12 MED ORDER — OXYCODONE-ACETAMINOPHEN 5-325 MG PO TABS
1.0000 | ORAL_TABLET | ORAL | Status: AC
  Administered 2014-06-12: 1 via ORAL
  Filled 2014-06-12: qty 1

## 2014-06-12 NOTE — ED Notes (Signed)
Rectal pain  Since sat has a bump that has gotten larger

## 2014-06-12 NOTE — Discharge Instructions (Signed)
Please follow the directions provided.  Be sure to follow up with your surgeon as soon as possible regarding this swollen hemorrhoid.   SEEK MEDICAL CARE IF:  You have increasing pain and swelling that is not controlled by treatment or medicine.  You have uncontrolled bleeding.  You have difficulty or you are unable to have a bowel movement.  You have pain or inflammation outside the area of the hemorrhoids.

## 2014-06-12 NOTE — ED Notes (Signed)
Called charge RN and advised we need to move patient to pod

## 2014-06-12 NOTE — ED Provider Notes (Signed)
CSN: [id number]     Arrival date & time 06/12/14  1012 History   First MD Initiated Contact with Patient 06/12/14 1127     Chief Complaint  Patient presents with  . Rectal Pain   (Consider location/radiation/quality/duration/timing/severity/associated sxs/prior Treatment) HPI Jordan Mcbride is a 43 yo female with c/o pain in rectum x 4 days.  Pt had rectal surgery for a prolapsed hemorrhoid 5 days ago.  She states she was doing well initially after surgery and then the following day, felt more pain and felt a bump on her rectum.  She endorses pain with urination or bowel movement. She denies fever, chills, nausea, or vomiting.  Past Medical History  Diagnosis Date  . Brain tumor     micro-pituitary adenoma  . Ovarian cyst    Past Surgical History  Procedure Laterality Date  . Eye surgery      removed eyelid cyst   No family history on file. History  Substance Use Topics  . Smoking status: Never Smoker   . Smokeless tobacco: Never Used  . Alcohol Use: Yes     Comment: socially   OB History   Grav Para Term Preterm Abortions TAB SAB Ect Mult Living   5 3 3  2  2   3      Review of Systems  Constitutional: Negative for fever and chills.  HENT: Negative for sore throat.   Eyes: Negative for visual disturbance.  Respiratory: Negative for cough and shortness of breath.   Cardiovascular: Negative for chest pain and leg swelling.  Gastrointestinal: Positive for rectal pain. Negative for nausea, vomiting and diarrhea.  Genitourinary: Negative for dysuria.  Musculoskeletal: Negative for myalgias.  Skin: Negative for rash.  Neurological: Negative for weakness, numbness and headaches.    Allergies  Review of patient's allergies indicates no known allergies.  Home Medications   Prior to Admission medications   Medication Sig Start Date End Date Taking? Authorizing Provider  cabergoline (DOSTINEX) 0.5 MG tablet Take 0.25 mg by mouth 2 (two) times a week.    Historical  Provider, MD  EPINEPHrine (EPIPEN) 0.3 mg/0.3 mL SOAJ injection Inject 0.3 mLs (0.3 mg total) into the muscle as needed. 12/24/13   Wandra Arthurs, MD  HYDROcodone-acetaminophen (NORCO) 5-325 MG per tablet Take 1-2 tablets by mouth every 4 (four) hours as needed. 02/25/14   Mariea Clonts, MD  hydrocortisone (ANUSOL-HC) 25 MG suppository Place 1 suppository (25 mg total) rectally 2 (two) times daily. For 7 days 02/25/14   Mariea Clonts, MD  naproxen (NAPROSYN) 375 MG tablet Take 1 tablet (375 mg total) by mouth 2 (two) times daily. 02/25/14   Mariea Clonts, MD  Prenatal Vit-Fe Fumarate-FA (PRENATAL MULTIVITAMIN) TABS tablet Take 1 tablet by mouth daily at 12 noon.    Historical Provider, MD   BP 133/58  Pulse 62  Temp(Src) 98.2 F (36.8 C) (Oral)  Resp 12  Ht 5\' 6"  (1.676 m)  Wt 200 lb (90.719 kg)  BMI 32.30 kg/m2  SpO2 99% Physical Exam  Nursing note and vitals reviewed. Constitutional: She appears well-developed and well-nourished. No distress.  HENT:  Head: Normocephalic and atraumatic.  Mouth/Throat: Oropharynx is clear and moist. No oropharyngeal exudate.  Eyes: Conjunctivae are normal.  Neck: Neck supple. No thyromegaly present.  Cardiovascular: Normal rate, regular rhythm and intact distal pulses.   Pulmonary/Chest: Effort normal and breath sounds normal. No respiratory distress. She has no wheezes. She has no rales. She exhibits no tenderness.  Abdominal: Soft. There is no tenderness.  Genitourinary:    Pelvic exam was performed with patient prone.  Musculoskeletal: She exhibits no tenderness.  Lymphadenopathy:    She has no cervical adenopathy.  Neurological: She is alert.  Skin: Skin is warm and dry. No rash noted. She is not diaphoretic.  Psychiatric: She has a normal mood and affect.    ED Course  Procedures (including critical care time) Labs Review Labs Reviewed - No data to display  Imaging Review No results found.   EKG Interpretation None      MDM    Final diagnoses:  External hemorrhoid, thrombosed   Hemorrhoids  Pt with recent surgery for hemorrhoids.  Now with thrombosed external hemorrhoid. Pain managed in the ED.  VSS and pt n now acute distress.  Discharge instructions include symptomatic management with  includes sitz baths 15 min TID, Anucort suppositories and follow-up with surgeon.  Pt in agreement with plan.  Return precautions provided.   Filed Vitals:   06/12/14 1334 06/12/14 1415 06/12/14 1430 06/12/14 1453  BP: 133/58 134/90 132/77 134/86  Pulse: 62 56 58 67  Temp:    98.2 F (36.8 C)  TempSrc:    Oral  Resp: 12   16  Height:      Weight:      SpO2: 99% 100% 100% 100%    Meds given in ED:  Medications  oxyCODONE-acetaminophen (PERCOCET/ROXICET) 5-325 MG per tablet 1 tablet (1 tablet Oral Given 06/12/14 1449)    Discharge Medication List as of 06/12/2014  2:50 PM    START taking these medications   Details  hydrocortisone (ANUCORT-HC) 25 MG suppository Place 1 suppository (25 mg total) rectally 2 (two) times daily., Starting 06/12/2014, Until Discontinued, Print           Britt Bottom, NP 06/15/14 2224

## 2014-06-16 NOTE — ED Provider Notes (Signed)
  Medical screening examination/treatment/procedure(s) were performed by non-physician practitioner and as supervising physician I was immediately available for consultation/collaboration.    Carmin Muskrat, MD 06/16/14 [phone]

## 2014-07-31 ENCOUNTER — Encounter (HOSPITAL_COMMUNITY): Payer: Self-pay | Admitting: Emergency Medicine

## 2014-09-24 IMAGING — CR DG THORACIC SPINE 2V
3 series · 3 of 3 positions shown · non-contrast
Comparison: None.

CLINICAL DATA: Upper back pain without trauma.

THORACIC SPINE - 2 VIEW

[w t-spine a.p.]
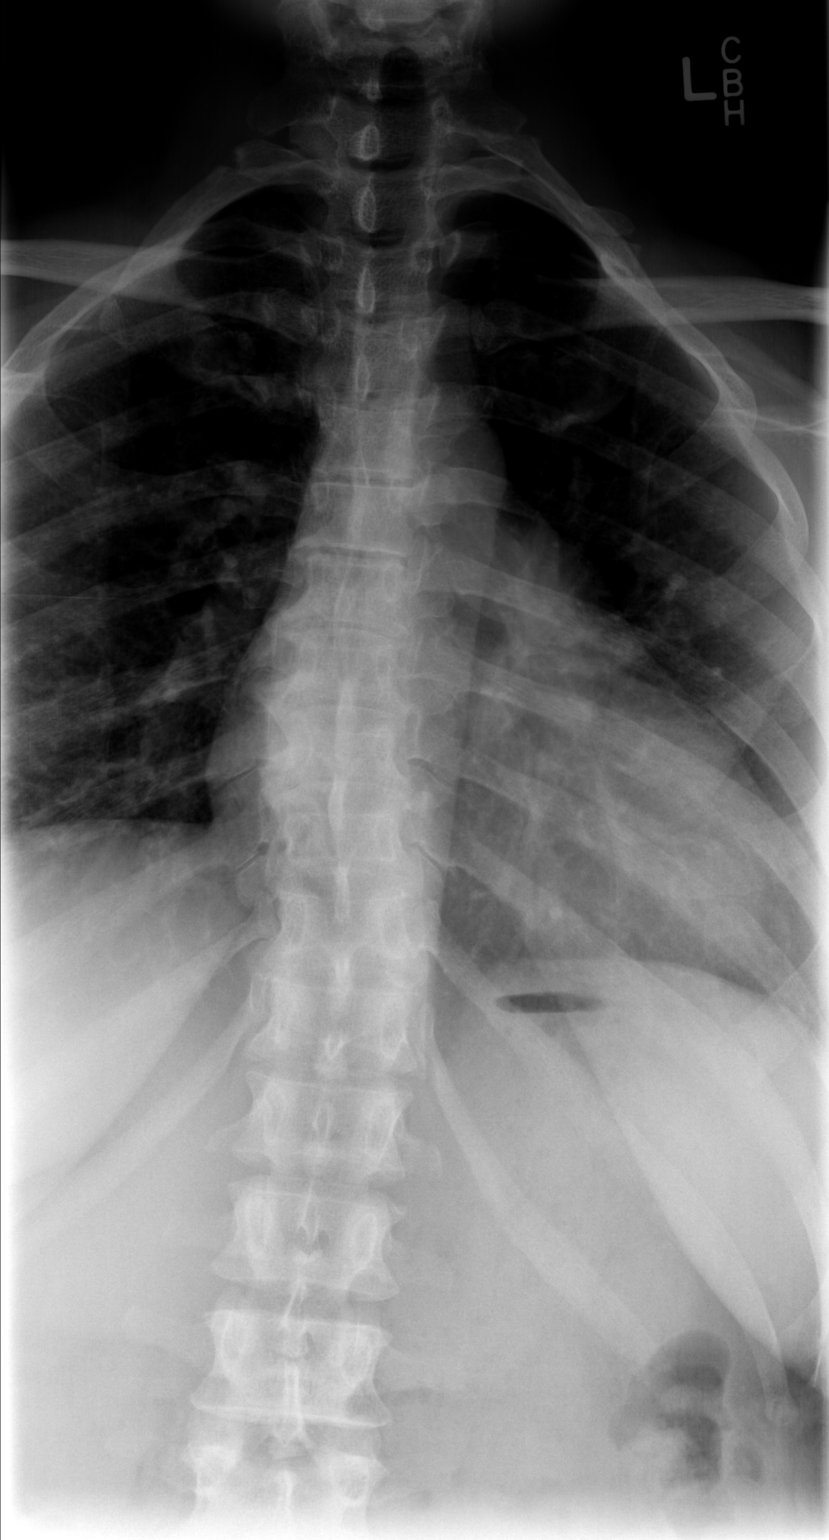

[w t-spine lat]
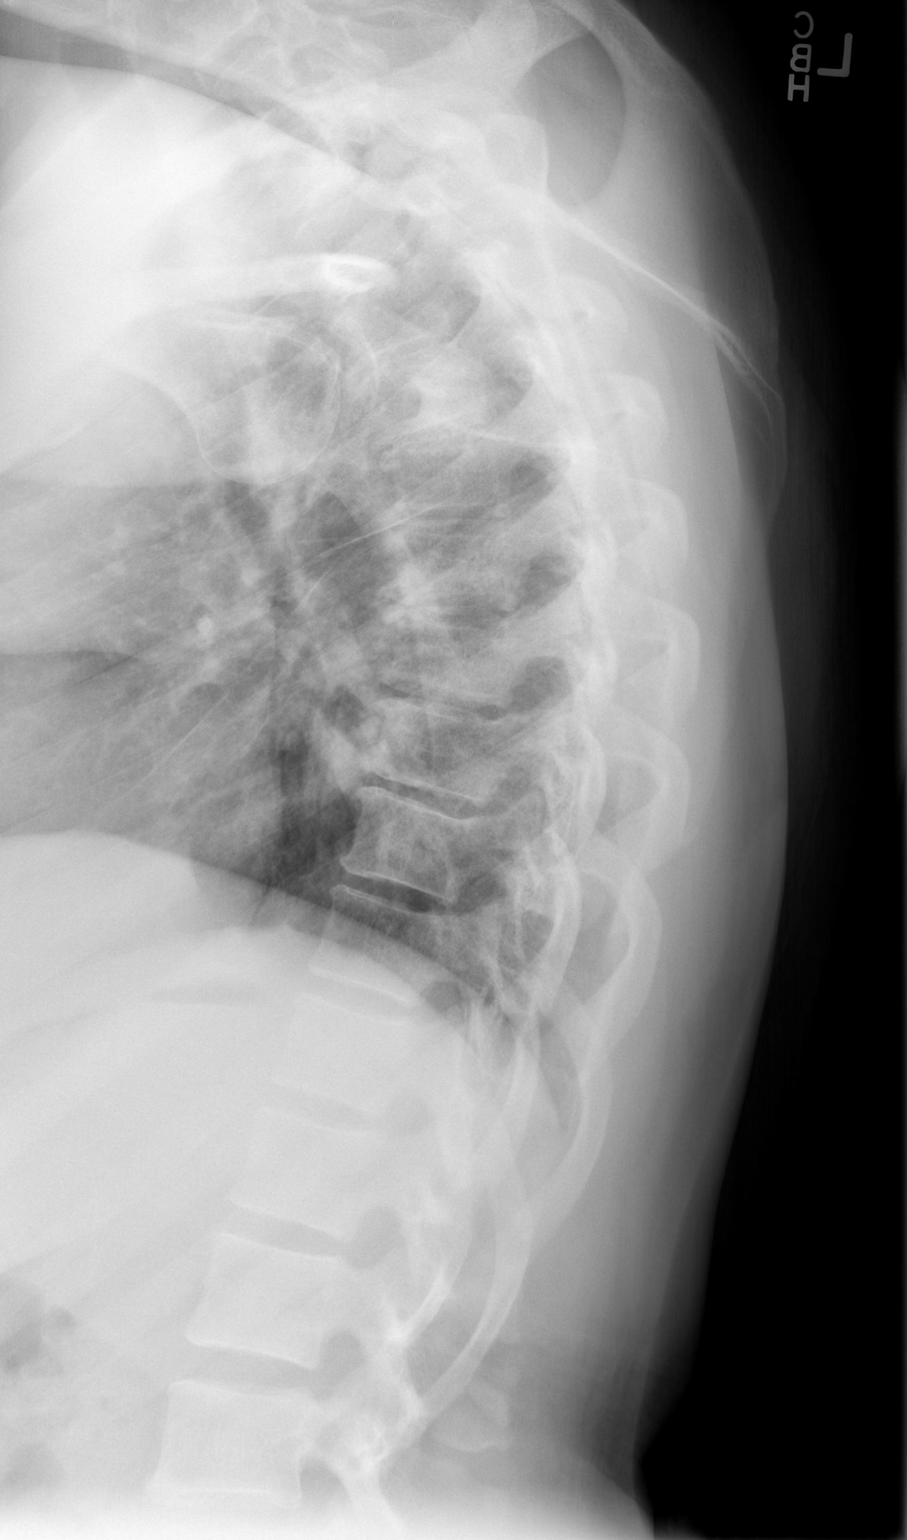

[w swimmers view]
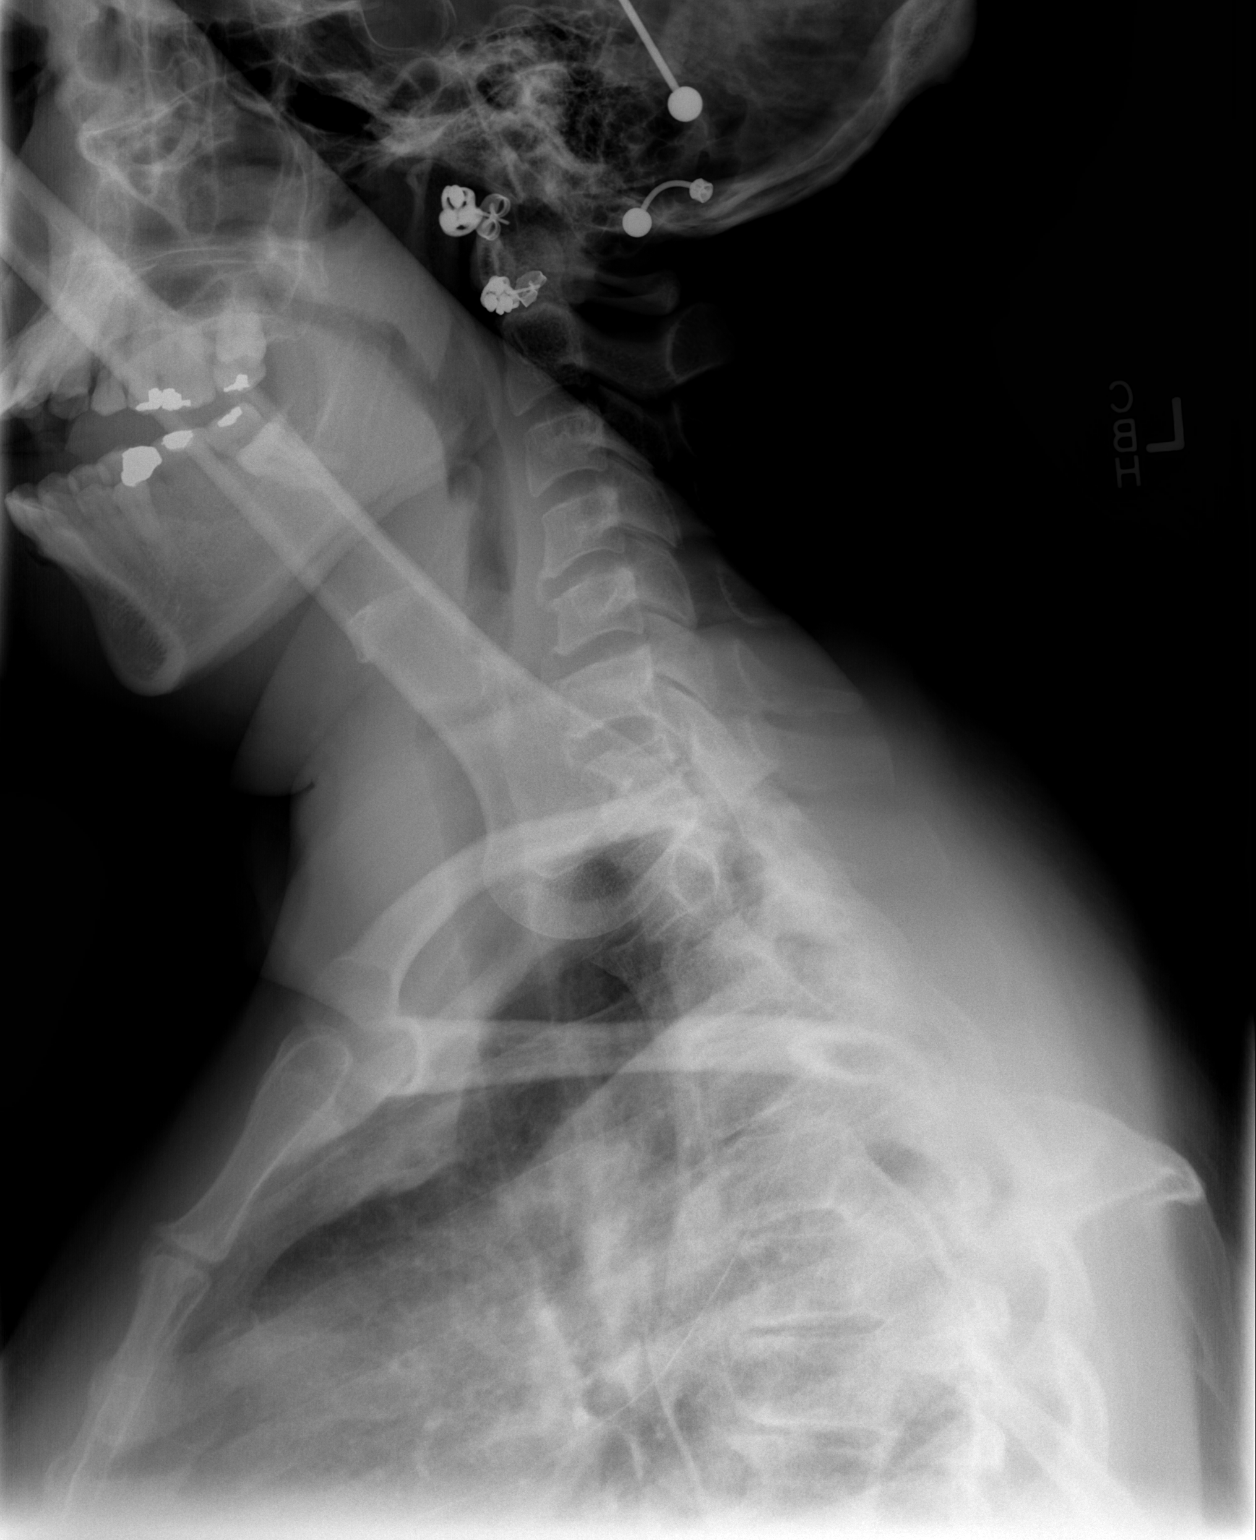

[3 of 3 positions shown; findings below may reference images not displayed]

FINDINGS: Frontal view demonstrates minimal S-shaped spinal
curvature.  Mild cardiomegaly is suspected, suboptimally evaluated.

The lateral view images from approximately T4-L2.  Maintenance of
vertebral body height across these levels.  Moderate mid thoracic
spondylosis.

Swimmer's view demonstrates maintenance of T1-T5 vertebral body
heights.  Cervical spondylosis incidentally noted at C4-C5 and C5-
C6.
IMPRESSION: Minimal spinal curvature, with age advanced mid thoracic
spondylosis, without acute osseous abnormality.

## 2015-08-28 ENCOUNTER — Other Ambulatory Visit: Payer: Self-pay | Admitting: Ophthalmology

## 2016-02-04 ENCOUNTER — Emergency Department (HOSPITAL_COMMUNITY)
Admission: EM | Admit: 2016-02-04 | Discharge: 2016-02-04 | Disposition: A | Discharge status: Home / Self Care | Payer: Medicaid Other | Attending: Emergency Medicine | Admitting: Emergency Medicine

## 2016-02-04 ENCOUNTER — Emergency Department (HOSPITAL_COMMUNITY): Payer: Medicaid Other

## 2016-02-04 ENCOUNTER — Encounter (HOSPITAL_COMMUNITY): Payer: Self-pay

## 2016-02-04 DIAGNOSIS — S29001A Unspecified injury of muscle and tendon of front wall of thorax, initial encounter: Secondary | ICD-10-CM | POA: Diagnosis not present

## 2016-02-04 DIAGNOSIS — S93401A Sprain of unspecified ligament of right ankle, initial encounter: Secondary | ICD-10-CM | POA: Insufficient documentation

## 2016-02-04 DIAGNOSIS — Y9389 Activity, other specified: Secondary | ICD-10-CM | POA: Diagnosis not present

## 2016-02-04 DIAGNOSIS — Z86011 Personal history of benign neoplasm of the brain: Secondary | ICD-10-CM | POA: Insufficient documentation

## 2016-02-04 DIAGNOSIS — S199XXA Unspecified injury of neck, initial encounter: Secondary | ICD-10-CM | POA: Diagnosis not present

## 2016-02-04 DIAGNOSIS — Z7952 Long term (current) use of systemic steroids: Secondary | ICD-10-CM | POA: Diagnosis not present

## 2016-02-04 DIAGNOSIS — Y998 Other external cause status: Secondary | ICD-10-CM | POA: Diagnosis not present

## 2016-02-04 DIAGNOSIS — S99911A Unspecified injury of right ankle, initial encounter: Secondary | ICD-10-CM | POA: Diagnosis present

## 2016-02-04 DIAGNOSIS — Z8742 Personal history of other diseases of the female genital tract: Secondary | ICD-10-CM | POA: Diagnosis not present

## 2016-02-04 DIAGNOSIS — Y92410 Unspecified street and highway as the place of occurrence of the external cause: Secondary | ICD-10-CM | POA: Diagnosis not present

## 2016-02-04 DIAGNOSIS — S3992XA Unspecified injury of lower back, initial encounter: Secondary | ICD-10-CM | POA: Diagnosis not present

## 2016-02-04 MED ORDER — HYDROCODONE-ACETAMINOPHEN 5-325 MG PO TABS: 1.0000 | ORAL_TABLET | ORAL | Status: DC | PRN

## 2016-02-04 MED ORDER — CYCLOBENZAPRINE HCL 10 MG PO TABS: 10.0000 mg | ORAL_TABLET | Freq: Two times a day (BID) | ORAL | Status: DC | PRN
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
## 2016-02-04 NOTE — Discharge Instructions (Signed)

## 2016-02-04 NOTE — ED Notes (Signed)
Pt walked to and from restroom with no difficulty.

## 2016-02-04 NOTE — ED Notes (Signed)
Patient transported to X-ray 
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
## 2016-02-04 NOTE — ED Notes (Signed)
Rob PA at bedside 

## 2016-12-11 ENCOUNTER — Encounter (HOSPITAL_COMMUNITY): Payer: Self-pay | Admitting: Emergency Medicine

## 2016-12-11 DIAGNOSIS — K429 Umbilical hernia without obstruction or gangrene: Secondary | ICD-10-CM | POA: Insufficient documentation

## 2016-12-11 DIAGNOSIS — Z79899 Other long term (current) drug therapy: Secondary | ICD-10-CM | POA: Insufficient documentation

## 2016-12-11 DIAGNOSIS — R109 Unspecified abdominal pain: Secondary | ICD-10-CM | POA: Diagnosis present

## 2016-12-11 NOTE — ED Triage Notes (Signed)
Pt c/o 9/10 upper left back pain and abd pain, denies any nausea or vomiting, LBM 2 days ago.

## 2016-12-12 ENCOUNTER — Emergency Department (HOSPITAL_COMMUNITY)
Admission: EM | Admit: 2016-12-12 | Discharge: 2016-12-12 | Disposition: A | Discharge status: Home / Self Care | Payer: Medicaid Other | Attending: Emergency Medicine | Admitting: Emergency Medicine

## 2016-12-12 ENCOUNTER — Emergency Department (HOSPITAL_COMMUNITY): Payer: Medicaid Other

## 2016-12-12 DIAGNOSIS — K429 Umbilical hernia without obstruction or gangrene: Secondary | ICD-10-CM

## 2016-12-12 DIAGNOSIS — R1033 Periumbilical pain: Secondary | ICD-10-CM

## 2016-12-12 LAB — CBC
HCT: 35.6 % — ABNORMAL LOW (ref 36.0–46.0)
Hemoglobin: 11.8 g/dL — ABNORMAL LOW (ref 12.0–15.0)
MCH: 28 pg (ref 26.0–34.0)
MCHC: 33.1 g/dL (ref 30.0–36.0)
MCV: 84.4 fL (ref 78.0–100.0)
PLATELETS: 203 10*3/uL (ref 150–400)
RBC: 4.22 MIL/uL (ref 3.87–5.11)
RDW: 14.4 % (ref 11.5–15.5)
WBC: 5.1 10*3/uL (ref 4.0–10.5)

## 2016-12-12 LAB — COMPREHENSIVE METABOLIC PANEL
ALK PHOS: 91 U/L (ref 38–126)
ALT: 13 U/L — AB (ref 14–54)
AST: 17 U/L (ref 15–41)
Albumin: 4 g/dL (ref 3.5–5.0)
Anion gap: 10 (ref 5–15)
BUN: 11 mg/dL (ref 6–20)
CALCIUM: 8.8 mg/dL — AB (ref 8.9–10.3)
CO2: 25 mmol/L (ref 22–32)
CREATININE: 0.99 mg/dL (ref 0.44–1.00)
Chloride: 105 mmol/L (ref 101–111)
GFR calc non Af Amer: >60 mL/min (ref >60)
Glucose, Bld: 117 mg/dL — ABNORMAL HIGH (ref 65–99)
Potassium: 3.3 mmol/L — ABNORMAL LOW (ref 3.5–5.1)
SODIUM: 140 mmol/L (ref 135–145)
Total Bilirubin: 0.4 mg/dL (ref 0.3–1.2)
Total Protein: 6.9 g/dL (ref 6.5–8.1)

## 2016-12-12 LAB — I-STAT TROPONIN, ED: Troponin i, poc: 0 ng/mL (ref 0.00–0.08)

## 2016-12-12 LAB — URINALYSIS, ROUTINE W REFLEX MICROSCOPIC
Bilirubin Urine: NEGATIVE
GLUCOSE, UA: NEGATIVE mg/dL
HGB URINE DIPSTICK: NEGATIVE
Ketones, ur: NEGATIVE mg/dL
Leukocytes, UA: NEGATIVE
Nitrite: NEGATIVE
PH: 5 (ref 5.0–8.0)
Protein, ur: 30 mg/dL — AB
SPECIFIC GRAVITY, URINE: 1.025 (ref 1.005–1.030)

## 2016-12-12 LAB — LIPASE, BLOOD: Lipase: 37 U/L (ref 11–51)

## 2016-12-12 MED ORDER — ACETAMINOPHEN 325 MG PO TABS: 650.0000 mg | ORAL_TABLET | Freq: Four times a day (QID) | ORAL | 0 refills | Status: DC | PRN

## 2016-12-12 MED ORDER — IBUPROFEN 400 MG PO TABS
600.0000 mg | ORAL_TABLET | Freq: Once | ORAL | Status: AC
  Administered 2016-12-12: 600 mg via ORAL
  Filled 2016-12-12: qty 1

## 2016-12-12 NOTE — Discharge Instructions (Signed)
I suspect that you have a umbilical hernia. Please see Surgeons if the pain continues to be a problem.  Please return to the ER if your symptoms worsen; you have increased pain, fevers, chills, inability to keep any medications down, confusion. Otherwise see the outpatient doctor as requested.

## 2016-12-12 NOTE — ED Provider Notes (Signed)
[facility] DEPT Provider Note   CSN: [id number] Arrival date & time: 12/11/16  2338   By signing my name below, I, Jordan Mcbride, attest that this documentation has been prepared under the direction and in the presence of Varney Biles, MD  Electronically Signed: Delton Mcbride, ED Scribe. 12/12/16. 4:09 AM.   History   Chief Complaint Chief Complaint  Patient presents with  . Back Pain  . Abdominal Pain   The history is provided by the patient. No language interpreter was used.   HPI Comments:  Jordan Mcbride is a 46 y.o. female who presents to the Emergency Department complaining of acute onset, moderate abdominal pain which began 2 days ago. Her abdominal pain is worse with coughing. She also reports back pain, a dry cough and chest pain secondary to coughing. No alleviating factors noted.  Pt denies a hx of similar symptoms, fevers, chills, nausea, vomiting, diarrhea, a hx of abdominal surgeries, change in appetite, SOB, a hx of blood clots in legs/lungs, any recent long distance travel, substance abuse, alcohol use or any other associated symptoms. Pt is a non-smoker.   Past Medical History:  Diagnosis Date  . Brain tumor ([place])    micro-pituitary adenoma  . Ovarian cyst     Patient Active Problem List   Diagnosis Date Noted  . Pelvic pain in female 11/08/2012  . Internal hemorrhoids 02/19/2012    Past Surgical History:  Procedure Laterality Date  . EYE SURGERY     removed eyelid cyst    OB History    Gravida Para Term Preterm AB Living   5 3 3   2 3    SAB TAB Ectopic Multiple Live Births   2               Home Medications    Prior to Admission medications   Medication Sig Start Date End Date Taking? Authorizing Provider  bromocriptine (PARLODEL) 2.5 MG tablet Take 2.5 mg by mouth daily.   Yes Historical Provider, MD  cyclobenzaprine (FLEXERIL) 10 MG tablet Take 1 tablet (10 mg total) by mouth 2 (two) times daily as needed for muscle spasms. 02/04/16  Yes  Montine Circle, PA-C  HYDROcodone-acetaminophen (NORCO/VICODIN) 5-325 MG tablet Take 1-2 tablets by mouth every 4 (four) hours as needed. Patient taking differently: Take 1-2 tablets by mouth every 4 (four) hours as needed for moderate pain.  02/04/16  Yes Montine Circle, PA-C  acetaminophen (TYLENOL) 325 MG tablet Take 2 tablets (650 mg total) by mouth every 6 (six) hours as needed. 12/12/16   Varney Biles, MD    Family History No family history on file.  Social History Social History  Substance Use Topics  . Smoking status: Never Smoker  . Smokeless tobacco: Never Used  . Alcohol use Yes     Comment: socially     Allergies   Patient has no known allergies.   Review of Systems Review of Systems 10 systems reviewed and all are negative for acute change except as noted in the HPI.  Physical Exam Updated Vital Signs BP (!) 144/86   Pulse 60   Temp 98.6 F (37 C) (Oral)   Resp 16   LMP 11/21/2016   SpO2 96%   Physical Exam  Constitutional: She is oriented to person, place, and time. She appears well-developed and well-nourished. No distress.  HENT:  Head: Normocephalic and atraumatic.  Eyes: EOM are normal.  Neck: Normal range of motion.  Cardiovascular: Normal rate, regular rhythm and normal  heart sounds.   Pulmonary/Chest: Effort normal and breath sounds normal. No respiratory distress. She has no wheezes.  Anterior lungs are clear.   Abdominal: Soft. She exhibits no distension. There is no tenderness. There is no rebound, no guarding and no tenderness at McBurney's point. A hernia is present.  No focal tenderness. Pt appears to have a ventral hernia that is reducible. Tenderness is localized right over the umbilical hernia.   Musculoskeletal: Normal range of motion.  No unilateral swelling. No calf tenderness. No pitting edema .   Neurological: She is alert and oriented to person, place, and time.  Skin: Skin is warm and dry.  Psychiatric: She has a normal mood  and affect. Judgment normal.  Nursing note and vitals reviewed.  ED Treatments / Results  DIAGNOSTIC STUDIES:  Oxygen Saturation is 100% on RA, normal by my interpretation.    COORDINATION OF CARE:  4:08 AM Discussed treatment plan with pt at bedside and pt agreed to plan.  Labs (all labs ordered are listed, but only abnormal results are displayed) Labs Reviewed  COMPREHENSIVE METABOLIC PANEL - Abnormal; Notable for the following:       Result Value   Potassium 3.3 (*)    Glucose, Bld 117 (*)    Calcium 8.8 (*)    ALT 13 (*)    All other components within normal limits  CBC - Abnormal; Notable for the following:    Hemoglobin 11.8 (*)    HCT 35.6 (*)    All other components within normal limits  URINALYSIS, ROUTINE W REFLEX MICROSCOPIC - Abnormal; Notable for the following:    APPearance HAZY (*)    Protein, ur 30 (*)    Bacteria, UA RARE (*)    Squamous Epithelial / LPF 6-30 (*)    All other components within normal limits  LIPASE, BLOOD  I-STAT TROPOININ, ED    EKG  EKG Interpretation  Date/Time:  Friday December 12 2016 00:01:31 EDT Ventricular Rate:  77 PR Interval:  172 QRS Duration: 80 QT Interval:  390 QTC Calculation: 441 R Axis:   62 Text Interpretation:  Normal sinus rhythm Nonspecific T wave abnormality Abnormal ECG No acute changes No significant change since last tracing Confirmed by Kathrynn Humble, MD, Thelma Comp [phone]) on 12/12/2016 5:14:43 AM       Radiology Dg Chest 2 View  Result Date: 12/12/2016 CLINICAL DATA:  Moderate abdominal pain for 2 days, worse with coughing. EXAM: CHEST  2 VIEW COMPARISON:  Chest radiograph Feb 04, 2016 FINDINGS: Cardiomediastinal silhouette is normal. No pleural effusions or focal consolidations. Trachea projects midline and there is no pneumothorax. Soft tissue planes and included osseous structures are non-suspicious. Moderate degenerative change of thoracic spine. IMPRESSION: No acute cardiopulmonary process. Electronically  Signed   By: Elon Alas M.D.   On: 12/12/2016 05:22    Procedures Procedures (including critical care time)  Medications Ordered in ED Medications  ibuprofen (ADVIL,MOTRIN) tablet 600 mg (600 mg Oral Given 12/12/16 0827)     Initial Impression / Assessment and Plan / ED Course  I have reviewed the triage vital signs and the nursing notes.  Pertinent labs & imaging results that were available during my care of the patient were reviewed by me and considered in my medical decision making (see chart for details).     Pt comes in with cough, some associated chest pain. She also c/o abd pain and back pain. All of her pains appear to be unrelated.  Cough and chest pain:  Pt has no cardiac risj factors. EKG has no acute changes. Trops not ordered, as I dont think her pain is cardiac in nature at all. Pt has some discomfort with cough over her chest, but there is no pain with deep inspiration. Pt is PERC neg. Pt will get CXR - if neg, no further workup.  Pt has generalized abd tenderness that is mild  - BUT THE TENDERNESS WAS SEVERE OVER THE UMBILICUS. She has no RUQ abd pain. Pt is passing flatus. She has no hx of abd surgery. So we dont think she has obstruction and also, it doesn't appear that pt has PID/TOA/Torsion.  Pt appears to be having an umbilical hernia.  No concerns got incarceration.  Final Clinical Impressions(s) / ED Diagnoses   Final diagnoses:  Umbilical hernia without obstruction and without gangrene  Periumbilical abdominal pain    New Prescriptions Discharge Medication List as of 12/12/2016  8:14 AM    START taking these medications   Details  acetaminophen (TYLENOL) 325 MG tablet Take 2 tablets (650 mg total) by mouth every 6 (six) hours as needed., Starting Fri 12/12/2016, Print       I personally performed the services described in this documentation, which was scribed in my presence. The recorded information has been reviewed and is accurate.       Varney Biles, MD 12/12/16 [phone]

## 2016-12-25 ENCOUNTER — Other Ambulatory Visit: Payer: Self-pay | Admitting: General Surgery

## 2017-02-11 ENCOUNTER — Encounter (HOSPITAL_BASED_OUTPATIENT_CLINIC_OR_DEPARTMENT_OTHER): Payer: Self-pay | Admitting: *Deleted

## 2017-02-16 NOTE — H&P (Signed)
Jordan Mcbride Location: [facility] Patient #: [id number] DOB: 1971/06/12 Single / Language: Jordan Mcbride / Race: Black or African American Female        History of Present Illness       The patient is a 46 year old female who presents with an umbilical hernia. This is a 46 year old female. Originally from the [place]. Moved here 8 years ago. She is referred by Dr. Kathrynn Mcbride in the emergency room for painful umbilical hernia.      She has noticed some pain and a little bit of a bulge for about 1 month. She went to the emergency department on March 15. They felt she had an umbilical hernia. No nausea vomiting and diffuse abdominal pain. She was referred       Past history reveals hemorrhoid surgery  by Dr. Rosendo Mcbride. She takes bromocriptine, Flexeril, Vicodin family history reveals mother living with hypertension. Father died of some type of cancer Social history reveals she is from the [place]. She is single. Has 2 daughters and 1 son. Denies alcohol or tobacco. Works at a [employer].        Because of the pain and the hernia she wants to have this repaired. She'll be still scheduled for open repair of umbilical hernia with mesh I discussed the indications, techniques and risk of the surgery in detail.   Allergies  No Known Drug Allergies  Allergies Reconciled   Medication History  Parlodel (2.5MG  Tablet, Oral daily) Active. Medications Reconciled  Vitals  Weight: 235.8 lb Height: 65in Body Surface Area: 2.12 m Body Mass Index: 39.24 kg/m  Temp.: 98.56F  Pulse: 79 (Regular)  BP: 116/62 (Sitting, Left Arm, Standard)    Physical Exam  General Mental Status-Alert. General Appearance-Consistent with stated age. Hydration-Well hydrated. Voice-Normal.  Head and Neck Head-normocephalic, atraumatic with no lesions or palpable masses. Trachea-midline. Thyroid Gland Characteristics - normal size and  consistency.  Eye Eyeball - Bilateral-Extraocular movements intact. Sclera/Conjunctiva - Bilateral-No scleral icterus.  Chest and Lung Exam Chest and lung exam reveals -quiet, even and easy respiratory effort with no use of accessory muscles and on auscultation, normal breath sounds, no adventitious sounds and normal vocal resonance. Inspection Chest Wall - Normal. Back - normal.  Cardiovascular Cardiovascular examination reveals -normal heart sounds, regular rate and rhythm with no murmurs and normal pedal pulses bilaterally.  Abdomen Note: Abdomen somewhat obese. BMI 39. She has a small umbilical hernia bulge which is reducible. A little bit tender. Skin is healthy. No organomegaly. No abdominal mass. No other hernias.   Neurologic Neurologic evaluation reveals -alert and oriented x 3 with no impairment of recent or remote memory. Mental Status-Normal.  Musculoskeletal Normal Exam - Left-Upper Extremity Strength Normal and Lower Extremity Strength Normal. Normal Exam - Right-Upper Extremity Strength Normal and Lower Extremity Strength Normal.  Lymphatic Head & Neck  General Head & Neck Lymphatics: Bilateral - Description - Normal. Axillary  General Axillary Region: Bilateral - Description - Normal. Tenderness - Non Tender. Femoral & Inguinal  Generalized Femoral & Inguinal Lymphatics: Bilateral - Description - Normal. Tenderness - Non Tender.    Assessment & Plan  UMBILICAL HERNIA WITHOUT OBSTRUCTION OR GANGRENE (K42.9)      You have a small umbilical hernia. This is the cause of your pain      You state that you would like to go ahead with surgery at this time because of the pain you'll be scheduled for open repair of umbilical hernia with mesh We have  discussed the indications, techniques, and risk of the surgery in detail Please read the patient information booklet that I gave you.  BMI 39.0-39.9,ADULT (Z68.39)    Jordan Mcbride. Jordan Mcbride, M.D.,  [facility] General and Minimally invasive Surgery Breast and Colorectal Surgery Office:   [phone] Pager:   [phone]

## 2017-02-17 ENCOUNTER — Encounter (HOSPITAL_BASED_OUTPATIENT_CLINIC_OR_DEPARTMENT_OTHER)
Admission: RE | Admit: 2017-02-17 | Discharge: 2017-02-17 | Disposition: A | Discharge status: Home / Self Care | Payer: Medicaid Other | Source: Ambulatory Visit | Attending: General Surgery | Admitting: General Surgery

## 2017-02-17 DIAGNOSIS — Z01818 Encounter for other preprocedural examination: Secondary | ICD-10-CM | POA: Insufficient documentation

## 2017-02-17 DIAGNOSIS — Z6841 Body Mass Index (BMI) 40.0 and over, adult: Secondary | ICD-10-CM | POA: Diagnosis not present

## 2017-02-17 DIAGNOSIS — K429 Umbilical hernia without obstruction or gangrene: Secondary | ICD-10-CM | POA: Diagnosis present

## 2017-02-17 LAB — COMPREHENSIVE METABOLIC PANEL
ALBUMIN: 3.9 g/dL (ref 3.5–5.0)
ALT: 21 U/L (ref 14–54)
AST: 22 U/L (ref 15–41)
Alkaline Phosphatase: 104 U/L (ref 38–126)
Anion gap: 7 (ref 5–15)
BUN: 12 mg/dL (ref 6–20)
CALCIUM: 8.6 mg/dL — AB (ref 8.9–10.3)
CO2: 24 mmol/L (ref 22–32)
CREATININE: 0.91 mg/dL (ref 0.44–1.00)
Chloride: 107 mmol/L (ref 101–111)
GFR calc non Af Amer: >60 mL/min (ref >60)
GLUCOSE: 143 mg/dL — AB (ref 65–99)
Potassium: 3.8 mmol/L (ref 3.5–5.1)
SODIUM: 138 mmol/L (ref 135–145)
TOTAL PROTEIN: 6.8 g/dL (ref 6.5–8.1)
Total Bilirubin: 0.6 mg/dL (ref 0.3–1.2)

## 2017-02-17 LAB — CBC WITH DIFFERENTIAL/PLATELET
BASOS PCT: 0 %
Basophils Absolute: 0 10*3/uL (ref 0.0–0.1)
EOS ABS: 0.2 10*3/uL (ref 0.0–0.7)
EOS PCT: 5 %
HCT: 36.7 % (ref 36.0–46.0)
Hemoglobin: 11.7 g/dL — ABNORMAL LOW (ref 12.0–15.0)
LYMPHS ABS: 1 10*3/uL (ref 0.7–4.0)
Lymphocytes Relative: 22 %
MCH: 27.2 pg (ref 26.0–34.0)
MCHC: 31.9 g/dL (ref 30.0–36.0)
MCV: 85.3 fL (ref 78.0–100.0)
MONO ABS: 0.2 10*3/uL (ref 0.1–1.0)
MONOS PCT: 5 %
NEUTROS PCT: 68 %
Neutro Abs: 3.1 10*3/uL (ref 1.7–7.7)
PLATELETS: 193 10*3/uL (ref 150–400)
RBC: 4.3 MIL/uL (ref 3.87–5.11)
RDW: 15.3 % (ref 11.5–15.5)
WBC: 4.6 10*3/uL (ref 4.0–10.5)

## 2017-02-17 NOTE — Progress Notes (Signed)
Labs drawn and boost drink given with instructions to complete by 0500 dos, pt verbalized understanding.

## 2017-02-18 ENCOUNTER — Encounter (HOSPITAL_BASED_OUTPATIENT_CLINIC_OR_DEPARTMENT_OTHER)
Admission: RE | Disposition: A | Discharge status: Home / Self Care | Payer: Self-pay | Source: Ambulatory Visit | Attending: General Surgery

## 2017-02-18 ENCOUNTER — Ambulatory Visit (HOSPITAL_BASED_OUTPATIENT_CLINIC_OR_DEPARTMENT_OTHER): Payer: Medicaid Other | Admitting: Anesthesiology

## 2017-02-18 ENCOUNTER — Encounter (HOSPITAL_BASED_OUTPATIENT_CLINIC_OR_DEPARTMENT_OTHER): Payer: Self-pay | Admitting: Anesthesiology

## 2017-02-18 ENCOUNTER — Ambulatory Visit (HOSPITAL_BASED_OUTPATIENT_CLINIC_OR_DEPARTMENT_OTHER)
Admission: RE | Admit: 2017-02-18 | Discharge: 2017-02-18 | Disposition: A | Discharge status: Home / Self Care | Payer: Medicaid Other | Source: Ambulatory Visit | Attending: General Surgery | Admitting: General Surgery

## 2017-02-18 DIAGNOSIS — Z6841 Body Mass Index (BMI) 40.0 and over, adult: Secondary | ICD-10-CM | POA: Diagnosis not present

## 2017-02-18 DIAGNOSIS — K429 Umbilical hernia without obstruction or gangrene: Secondary | ICD-10-CM | POA: Diagnosis present

## 2017-02-18 HISTORY — DX: Umbilical hernia without obstruction or gangrene: K42.9

## 2017-02-18 HISTORY — PX: INSERTION OF MESH: SHX5868

## 2017-02-18 HISTORY — PX: UMBILICAL HERNIA REPAIR: SHX196

## 2017-02-18 SURGERY — REPAIR, HERNIA, UMBILICAL, ADULT
Anesthesia: General | Site: Abdomen

## 2017-02-18 MED ORDER — HYDROMORPHONE HCL 1 MG/ML IJ SOLN
INTRAMUSCULAR | Status: AC
  Filled 2017-02-18: qty 1

## 2017-02-18 MED ORDER — ACETAMINOPHEN 500 MG PO TABS
ORAL_TABLET | ORAL | Status: AC
  Filled 2017-02-18: qty 2

## 2017-02-18 MED ORDER — PROPOFOL 500 MG/50ML IV EMUL
INTRAVENOUS | Status: AC
  Filled 2017-02-18: qty 50

## 2017-02-18 MED ORDER — DEXAMETHASONE SODIUM PHOSPHATE 10 MG/ML IJ SOLN
INTRAMUSCULAR | Status: AC
  Filled 2017-02-18: qty 1

## 2017-02-18 MED ORDER — MIDAZOLAM HCL 2 MG/2ML IJ SOLN
1.0000 mg | INTRAMUSCULAR | Status: DC | PRN
  Administered 2017-02-18: 2 mg via INTRAVENOUS

## 2017-02-18 MED ORDER — FENTANYL CITRATE (PF) 100 MCG/2ML IJ SOLN
INTRAMUSCULAR | Status: AC
  Filled 2017-02-18: qty 2

## 2017-02-18 MED ORDER — CHLORHEXIDINE GLUCONATE CLOTH 2 % EX PADS: 6.0000 | MEDICATED_PAD | Freq: Once | CUTANEOUS | Status: DC

## 2017-02-18 MED ORDER — LIDOCAINE 2% (20 MG/ML) 5 ML SYRINGE
INTRAMUSCULAR | Status: AC
  Filled 2017-02-18: qty 5

## 2017-02-18 MED ORDER — HYDROCODONE-ACETAMINOPHEN 5-325 MG PO TABS: 1.0000 | ORAL_TABLET | Freq: Four times a day (QID) | ORAL | 0 refills | Status: DC | PRN

## 2017-02-18 MED ORDER — BUPIVACAINE HCL 0.5 % IJ SOLN
INTRAMUSCULAR | Status: DC | PRN
  Administered 2017-02-18: 10 mL

## 2017-02-18 MED ORDER — CEFAZOLIN SODIUM-DEXTROSE 2-4 GM/100ML-% IV SOLN
INTRAVENOUS | Status: AC
  Filled 2017-02-18: qty 100

## 2017-02-18 MED ORDER — OXYCODONE HCL 5 MG/5ML PO SOLN: 5.0000 mg | Freq: Once | ORAL | Status: AC | PRN

## 2017-02-18 MED ORDER — SODIUM CHLORIDE 0.9 % IJ SOLN
INTRAMUSCULAR | Status: AC
  Filled 2017-02-18: qty 20

## 2017-02-18 MED ORDER — CELECOXIB 400 MG PO CAPS
400.0000 mg | ORAL_CAPSULE | ORAL | Status: AC
  Administered 2017-02-18: 400 mg via ORAL

## 2017-02-18 MED ORDER — ACETAMINOPHEN 500 MG PO TABS
1000.0000 mg | ORAL_TABLET | ORAL | Status: AC
  Administered 2017-02-18: 1000 mg via ORAL

## 2017-02-18 MED ORDER — OXYCODONE HCL 5 MG PO TABS
5.0000 mg | ORAL_TABLET | Freq: Once | ORAL | Status: AC | PRN
  Administered 2017-02-18: 5 mg via ORAL

## 2017-02-18 MED ORDER — DEXTROSE 5 % IV SOLN
3.0000 g | INTRAVENOUS | Status: AC
  Administered 2017-02-18: 3 g via INTRAVENOUS

## 2017-02-18 MED ORDER — GABAPENTIN 300 MG PO CAPS
300.0000 mg | ORAL_CAPSULE | ORAL | Status: AC
  Administered 2017-02-18: 300 mg via ORAL

## 2017-02-18 MED ORDER — CEFAZOLIN SODIUM-DEXTROSE 1-4 GM/50ML-% IV SOLN
INTRAVENOUS | Status: AC
  Filled 2017-02-18: qty 50

## 2017-02-18 MED ORDER — MIDAZOLAM HCL 2 MG/2ML IJ SOLN
INTRAMUSCULAR | Status: AC
  Filled 2017-02-18: qty 2

## 2017-02-18 MED ORDER — OXYCODONE HCL 5 MG PO TABS
ORAL_TABLET | ORAL | Status: AC
  Filled 2017-02-18: qty 1

## 2017-02-18 MED ORDER — GABAPENTIN 300 MG PO CAPS
ORAL_CAPSULE | ORAL | Status: AC
  Filled 2017-02-18: qty 1

## 2017-02-18 MED ORDER — CELECOXIB 200 MG PO CAPS
ORAL_CAPSULE | ORAL | Status: AC
  Filled 2017-02-18: qty 2

## 2017-02-18 MED ORDER — HYDROMORPHONE HCL 1 MG/ML IJ SOLN
0.2500 mg | INTRAMUSCULAR | Status: DC | PRN
  Administered 2017-02-18 (×2): 0.5 mg via INTRAVENOUS

## 2017-02-18 MED ORDER — PROMETHAZINE HCL 25 MG/ML IJ SOLN: 6.2500 mg | INTRAMUSCULAR | Status: DC | PRN

## 2017-02-18 MED ORDER — FENTANYL CITRATE (PF) 100 MCG/2ML IJ SOLN
50.0000 ug | INTRAMUSCULAR | Status: AC | PRN
  Administered 2017-02-18: 100 ug via INTRAVENOUS
  Administered 2017-02-18: 25 ug via INTRAVENOUS
  Administered 2017-02-18: 50 ug via INTRAVENOUS

## 2017-02-18 MED ORDER — ONDANSETRON HCL 4 MG/2ML IJ SOLN
INTRAMUSCULAR | Status: AC
  Filled 2017-02-18: qty 2

## 2017-02-18 MED ORDER — SUCCINYLCHOLINE CHLORIDE 200 MG/10ML IV SOSY
PREFILLED_SYRINGE | INTRAVENOUS | Status: DC | PRN
  Administered 2017-02-18: 120 mg via INTRAVENOUS

## 2017-02-18 MED ORDER — SCOPOLAMINE 1 MG/3DAYS TD PT72: 1.0000 | MEDICATED_PATCH | Freq: Once | TRANSDERMAL | Status: DC | PRN

## 2017-02-18 MED ORDER — DEXAMETHASONE SODIUM PHOSPHATE 4 MG/ML IJ SOLN
INTRAMUSCULAR | Status: DC | PRN
  Administered 2017-02-18: 10 mg via INTRAVENOUS

## 2017-02-18 MED ORDER — BUPIVACAINE LIPOSOME 1.3 % IJ SUSP
INTRAMUSCULAR | Status: AC
  Filled 2017-02-18: qty 20

## 2017-02-18 MED ORDER — PROPOFOL 10 MG/ML IV BOLUS
INTRAVENOUS | Status: DC | PRN
  Administered 2017-02-18 (×2): 50 mg via INTRAVENOUS
  Administered 2017-02-18: 150 mg via INTRAVENOUS

## 2017-02-18 MED ORDER — LIDOCAINE 2% (20 MG/ML) 5 ML SYRINGE
INTRAMUSCULAR | Status: DC | PRN
Start: 2017-02-18 — End: 2017-02-18
  Administered 2017-02-18: 100 mg via INTRAVENOUS

## 2017-02-18 MED ORDER — LACTATED RINGERS IV SOLN
INTRAVENOUS | Status: DC
  Administered 2017-02-18 (×2): via INTRAVENOUS

## 2017-02-18 MED ORDER — MEPERIDINE HCL 25 MG/ML IJ SOLN: 6.2500 mg | INTRAMUSCULAR | Status: DC | PRN

## 2017-02-18 MED ORDER — ONDANSETRON HCL 4 MG/2ML IJ SOLN
INTRAMUSCULAR | Status: DC | PRN
  Administered 2017-02-18: 4 mg via INTRAVENOUS

## 2017-02-18 SURGICAL SUPPLY — 74 items
APPLICATOR COTTON TIP 6IN STRL (MISCELLANEOUS) ×4 IMPLANT
BENZOIN TINCTURE PRP APPL 2/3 (GAUZE/BANDAGES/DRESSINGS) IMPLANT
BLADE CLIPPER SURG (BLADE) IMPLANT
BLADE HEX COATED 2.75 (ELECTRODE) ×4 IMPLANT
BLADE SURG 10 STRL SS (BLADE) ×4 IMPLANT
BLADE SURG 15 STRL LF DISP TIS (BLADE) ×2 IMPLANT
BLADE SURG 15 STRL SS (BLADE) ×2
CANISTER SUCT 1200ML W/VALVE (MISCELLANEOUS) ×4 IMPLANT
CHLORAPREP W/TINT 26ML (MISCELLANEOUS) ×4 IMPLANT
CLOSURE WOUND 1/2 X4 (GAUZE/BANDAGES/DRESSINGS)
CLOSURE WOUND 1/4X4 (GAUZE/BANDAGES/DRESSINGS)
COVER BACK TABLE 60X90IN (DRAPES) ×8 IMPLANT
COVER MAYO STAND STRL (DRAPES) ×4 IMPLANT
DECANTER SPIKE VIAL GLASS SM (MISCELLANEOUS) ×4 IMPLANT
DERMABOND ADVANCED (GAUZE/BANDAGES/DRESSINGS)
DERMABOND ADVANCED .7 DNX12 (GAUZE/BANDAGES/DRESSINGS) IMPLANT
DRAIN PENROSE 1/2X12 LTX STRL (WOUND CARE) ×4 IMPLANT
DRAPE LAPAROTOMY 100X72 PEDS (DRAPES) ×4 IMPLANT
DRAPE LAPAROTOMY TRNSV 102X78 (DRAPE) IMPLANT
DRAPE UTILITY XL STRL (DRAPES) ×4 IMPLANT
ELECT REM PT RETURN 9FT ADLT (ELECTROSURGICAL) ×4
ELECTRODE REM PT RTRN 9FT ADLT (ELECTROSURGICAL) ×2 IMPLANT
GAUZE SPONGE 4X4 12PLY STRL LF (GAUZE/BANDAGES/DRESSINGS) IMPLANT
GLOVE BIOGEL PI IND STRL 8 (GLOVE) ×2 IMPLANT
GLOVE BIOGEL PI INDICATOR 8 (GLOVE) ×2
GLOVE EUDERMIC 7 POWDERFREE (GLOVE) ×4 IMPLANT
GLOVE SURG SS PI 7.0 STRL IVOR (GLOVE) ×4 IMPLANT
GOWN STRL REUS W/ TWL LRG LVL3 (GOWN DISPOSABLE) ×2 IMPLANT
GOWN STRL REUS W/ TWL XL LVL3 (GOWN DISPOSABLE) ×2 IMPLANT
GOWN STRL REUS W/TWL LRG LVL3 (GOWN DISPOSABLE) ×2
GOWN STRL REUS W/TWL XL LVL3 (GOWN DISPOSABLE) ×2
MESH VENTRALEX ST 2.5 CRC MED (Mesh General) ×4 IMPLANT
NEEDLE HYPO 22GX1.5 SAFETY (NEEDLE) ×4 IMPLANT
NEEDLE HYPO 25X1 1.5 SAFETY (NEEDLE) ×4 IMPLANT
NS IRRIG 1000ML POUR BTL (IV SOLUTION) ×4 IMPLANT
PACK BASIN DAY SURGERY FS (CUSTOM PROCEDURE TRAY) ×4 IMPLANT
PENCIL BUTTON HOLSTER BLD 10FT (ELECTRODE) ×4 IMPLANT
SLEEVE SCD COMPRESS KNEE MED (MISCELLANEOUS) ×4 IMPLANT
SPONGE LAP 4X18 X RAY DECT (DISPOSABLE) ×4 IMPLANT
STAPLER VISISTAT 35W (STAPLE) IMPLANT
STRIP CLOSURE SKIN 1/2X4 (GAUZE/BANDAGES/DRESSINGS) IMPLANT
STRIP CLOSURE SKIN 1/4X4 (GAUZE/BANDAGES/DRESSINGS) IMPLANT
SUT MNCRL AB 4-0 PS2 18 (SUTURE) ×4 IMPLANT
SUT NOVA 0 T19/GS 22DT (SUTURE) IMPLANT
SUT NOVA NAB DX-16 0-1 5-0 T12 (SUTURE) IMPLANT
SUT PDS AB 0 CT 36 (SUTURE) IMPLANT
SUT PROLENE 0 CT 1 CR/8 (SUTURE) IMPLANT
SUT PROLENE 0 CT 2 (SUTURE) IMPLANT
SUT PROLENE 1 CT (SUTURE) IMPLANT
SUT PROLENE 2 0 CT2 30 (SUTURE) ×8 IMPLANT
SUT SILK 2 0 SH (SUTURE) IMPLANT
SUT SILK 2 0 TIES 17X18 (SUTURE) ×2
SUT SILK 2-0 18XBRD TIE BLK (SUTURE) ×2 IMPLANT
SUT SILK 3 0 SH 30 (SUTURE) IMPLANT
SUT VIC AB 2-0 CT1 27 (SUTURE)
SUT VIC AB 2-0 CT1 TAPERPNT 27 (SUTURE) IMPLANT
SUT VIC AB 2-0 SH 27 (SUTURE) ×2
SUT VIC AB 2-0 SH 27XBRD (SUTURE) ×2 IMPLANT
SUT VIC AB 3-0 54X BRD REEL (SUTURE) IMPLANT
SUT VIC AB 3-0 BRD 54 (SUTURE)
SUT VIC AB 3-0 FS2 27 (SUTURE) IMPLANT
SUT VIC AB 3-0 SH 27 (SUTURE) ×2
SUT VIC AB 3-0 SH 27X BRD (SUTURE) ×2 IMPLANT
SUT VICRYL 3-0 CR8 SH (SUTURE) IMPLANT
SUT VICRYL AB 2 0 TIE (SUTURE) ×2 IMPLANT
SUT VICRYL AB 2 0 TIES (SUTURE) ×2
SYR 10ML LL (SYRINGE) ×4 IMPLANT
SYR BULB 3OZ (MISCELLANEOUS) IMPLANT
TOWEL OR 17X24 6PK STRL BLUE (TOWEL DISPOSABLE) ×8 IMPLANT
TOWEL OR NON WOVEN STRL DISP B (DISPOSABLE) ×4 IMPLANT
TRAY DSU PREP LF (CUSTOM PROCEDURE TRAY) ×4 IMPLANT
TUBE CONNECTING 20'X1/4 (TUBING) ×1
TUBE CONNECTING 20X1/4 (TUBING) ×3 IMPLANT
YANKAUER SUCT BULB TIP NO VENT (SUCTIONS) ×4 IMPLANT

## 2017-02-18 NOTE — Transfer of Care (Signed)
Immediate Anesthesia Transfer of Care Note  Patient: Jordan Mcbride  Procedure(s) Performed: Procedure(s): REPAIR UMBILICAL HERNIA WITH MESH (N/A) INSERTION OF MESH (N/A)  Patient Location: PACU  Anesthesia Type:General  Level of Consciousness: awake, sedated and confused  Airway & Oxygen Therapy: Patient Spontanous Breathing and Patient connected to face mask oxygen  Post-op Assessment: Report given to RN and Post -op Vital signs reviewed and stable  Post vital signs: Reviewed and stable  Last Vitals:  Vitals:   02/18/17 0738  BP: (!) 142/80  Pulse: 74  Resp: 18  Temp: 36.9 C    Last Pain:  Vitals:   02/18/17 0738  TempSrc: Oral      Patients Stated Pain Goal: 0 (81/27/51 7001)  Complications: No apparent anesthesia complications

## 2017-02-18 NOTE — Op Note (Signed)
Patient Name:           Jordan Mcbride   Date of Surgery:        02/18/2017  Pre op Diagnosis:      Umbilical hernia  Post op Diagnosis:    Umbilical hernia  Procedure:                 Open repair of umbilical hernia with mesh  Surgeon:                     Edsel Petrin. Dalbert Batman, M.D., FACS  Assistant:                      OR staff   Indication for Assistant: n/a  Operative Indications:    This is a 46 year old female. Originally from the [place]. Moved here 8 years ago. She is referred by Dr. Kathrynn Humble in the emergency room for painful umbilical hernia.      She has noticed some pain and a little bit of a bulge for about 1 month. She went to the emergency department on March 15. They felt she had an umbilical hernia. No nausea vomiting or diffuse abdominal pain. She was referred   Exam reveals an umbilical hernia, difficult to feel the defect.  Hernia sac protruding to the right superior and about 3 cm in size.        Because of the pain and the hernia she wants to have this repaired. She'll be still scheduled for open repair of umbilical hernia with mesh I discussed the indications, techniques and risk of the surgery in detail.  Operative Findings:       The umbilical defect was about 2 cm in size.  I could not feel any hernia defects above or below the umbilicus once I was able to place my finger under the fascia.  I debrided the entire hernia sac.  The hernia was repaired with an inlay mesh, ventralex-ST mesh disc, 6.4 cm diameter.  Procedure in Detail:          Following the induction of general endotracheal anesthesia the patient's abdomen was prepped and draped in a sterile fashion.  Surgical timeout was performed.  Intravenous antibiotics were given.  0.5% Marcaine was used as local infiltration anesthetic.  A transverse curvilinear incision was made at the lower rim of the umbilicus.  Dissection was carried down through subcutaneous tissue down to the fascia at the base of the  umbilicus.  The umbilical skin was dissected away from the umbilical hernia sac.  We entered the sac and found no incarcerated intestine.  The hernia sac was slowly debrided.  I undermined the subcutaneous tissue circumferentially.  The 6.4 cm composite mesh disc is brought to the operating field.  The tails were cut off.  I placed four fixation sutures of #1 Novafil.  These were mattress sutures placed down through the fascia, through the mesh and then back up through the fascia.  All 4 of these were placed and then the mesh was folded and inserted in the subfascial plane.  It deployed point nicely without any redundancy.  The 4 sutures were tied.  The wound was irrigated.  The fascia was closed transversely with interrupted #1 Novafil.  The subcutaneous tissue was closed with 3-0 Vicryl and skin closed with running subcuticular 4-0 Monocryl and Dermabond.  The patient tolerated the procedure well was taken to PACU in stable condition.  EBL 15 mL.  Counts  correct.  Complications none.     Edsel Petrin. Dalbert Batman, M.D., FACS General and Minimally Invasive Surgery Breast and Colorectal Surgery   Addendum: I logged onto the [facility]  website and reviewed her prescription medication history  02/18/2017 9:28 AM

## 2017-02-18 NOTE — Progress Notes (Signed)
Pt crying and complaining of pain at a 10, meds given and pt falling quickly asleep.  Medications given, VSS, respiratory rate to 13 when asleep. Will continue to monitor.

## 2017-02-18 NOTE — Progress Notes (Signed)
Pt with hypertension, asleep and otherwise stable. Dr. Sabra Heck notified and no new orders received, okay to discharge when awake.

## 2017-02-18 NOTE — Anesthesia Preprocedure Evaluation (Addendum)
Anesthesia Evaluation  Patient identified by MRN, date of birth, ID band Patient awake    Reviewed: Allergy & Precautions, NPO status , Patient's Chart, lab work & pertinent test results  Airway Mallampati: II  TM Distance: >3 FB Neck ROM: Full    Dental no notable dental hx.    Pulmonary neg pulmonary ROS,    Pulmonary exam normal breath sounds clear to auscultation       Cardiovascular negative cardio ROS Normal cardiovascular exam Rhythm:Regular Rate:Normal     Neuro/Psych Pituitary adenoma negative psych ROS   GI/Hepatic negative GI ROS, Neg liver ROS,   Endo/Other  negative endocrine ROSMorbid obesity  Renal/GU negative Renal ROS  negative genitourinary   Musculoskeletal negative musculoskeletal ROS (+)   Abdominal   Peds negative pediatric ROS (+)  Hematology negative hematology ROS (+)   Anesthesia Other Findings   Reproductive/Obstetrics negative OB ROS                            Anesthesia Physical Anesthesia Plan  ASA: III  Anesthesia Plan: General   Post-op Pain Management:    Induction: Intravenous  Airway Management Planned: Oral ETT  Additional Equipment:   Intra-op Plan:   Post-operative Plan: Extubation in OR  Informed Consent: I have reviewed the patients History and Physical, chart, labs and discussed the procedure including the risks, benefits and alternatives for the proposed anesthesia with the patient or authorized representative who has indicated his/her understanding and acceptance.   Dental advisory given  Plan Discussed with: CRNA  Anesthesia Plan Comments:        Anesthesia Quick Evaluation

## 2017-02-18 NOTE — Discharge Instructions (Signed)
[facility] _______[facility]  UMBILICAL  HERNIA REPAIR: POST OP INSTRUCTIONS  Always review your discharge instruction sheet given to you by the facility where your surgery was performed. IF YOU HAVE DISABILITY OR FAMILY LEAVE FORMS, YOU MUST BRING THEM TO THE OFFICE FOR PROCESSING.   DO NOT GIVE THEM TO YOUR DOCTOR.  1. A  prescription for pain medication may be given to you upon discharge.  Take your pain medication as prescribed, if needed.  If narcotic pain medicine is not needed, then you may take acetaminophen (Tylenol) or ibuprofen (Advil) as needed. 2. Take your usually prescribed medications unless otherwise directed. If you need a refill on your pain medication, please contact your pharmacy.  They will contact our office to request authorization. Prescriptions will not be filled after 5 pm or on week-ends. 3. You should follow a light diet the first 24 hours after arrival home, such as soup and crackers, etc.  Be sure to include lots of fluids daily.  Resume your normal diet the day after surgery. 4.Most patients will experience some swelling and bruising around the umbilicus .  Ice packs and reclining will help.  Swelling and bruising can take several days to resolve.  6. It is common to experience some constipation if taking pain medication after surgery.  Increasing fluid intake and taking a stool softener (such as Colace) will usually help or prevent this problem from occurring.  A mild laxative (Milk of Magnesia or Miralax) should be taken according to package directions if there are no bowel movements after 48 hours. 7. Unless discharge instructions indicate otherwise, you may remove your bandages 24-48 hours after surgery, and you may shower at that time.  You may have steri-strips (small skin tapes) in place directly over the incision.  These strips should be left on the skin for 7-10 days.  If your surgeon used skin glue on the incision, you may shower in 24 hours.  The  glue will flake off over the next 2-3 weeks.  Any sutures or staples will be removed at the office during your follow-up visit. 8. ACTIVITIES:  You may resume regular (light) daily activities beginning the next day--such as daily self-care, walking, climbing stairs--gradually increasing activities as tolerated.  You may have sexual intercourse when it is comfortable.  Refrain from any heavy lifting or straining until approved by your doctor.  a.You may drive when you are no longer taking prescription pain medication, you can comfortably wear a seatbelt, and you can safely maneuver your car and apply brakes. b.RETURN TO WORK:   _____________________________________________  9.You should see your doctor in the office for a follow-up appointment approximately 2-3 weeks after your surgery.  Make sure that you call for this appointment within a day or two after you arrive home to insure a convenient appointment time. 10.OTHER INSTRUCTIONS: _________________________    _____________________________________  WHEN TO CALL YOUR DOCTOR: 1. Fever over 101.0 2. Inability to urinate 3. Nausea and/or vomiting 4. Extreme swelling or bruising 5. Continued bleeding from incision. 6. Increased pain, redness, or drainage from the incision  The clinic staff is available to answer your questions during regular business hours.  Please dont hesitate to call and ask to speak to one of the nurses for clinical concerns.  If you have a medical emergency, go to the nearest emergency room or call 911.  A surgeon from [facility] is always on call at the hospital   [address], [place],  [place], [place]  [address] ?  [address] [place], [place]   [address] [phone] ? [phone] ? FAX [phone] Web site: [email or web address]  Post Anesthesia Home Care Instructions  Activity: Get plenty of rest for the remainder of the day. A responsible individual must stay with you for  24 hours following the procedure.  For the next 24 hours, DO NOT: -Drive a car -Paediatric nurse -Drink alcoholic beverages -Take any medication unless instructed by your physician -Make any legal decisions or sign important papers.  Meals: Start with liquid foods such as gelatin or soup. Progress to regular foods as tolerated. Avoid greasy, spicy, heavy foods. If nausea and/or vomiting occur, drink only clear liquids until the nausea and/or vomiting subsides. Call your physician if vomiting continues.  Special Instructions/Symptoms: Your throat may feel dry or sore from the anesthesia or the breathing tube placed in your throat during surgery. If this causes discomfort, gargle with warm salt water. The discomfort should disappear within 24 hours.  If you had a scopolamine patch placed behind your ear for the management of post- operative nausea and/or vomiting:  1. The medication in the patch is effective for 72 hours, after which it should be removed.  Wrap patch in a tissue and discard in the trash. Wash hands thoroughly with soap and water. 2. You may remove the patch earlier than 72 hours if you experience unpleasant side effects which may include dry mouth, dizziness or visual disturbances. 3. Avoid touching the patch. Wash your hands with soap and water after contact with the patch.

## 2017-02-18 NOTE — Anesthesia Procedure Notes (Signed)
Procedure Name: Intubation Date/Time: 02/18/2017 8:29 AM Performed by: Lyndee Leo Pre-anesthesia Checklist: Patient identified, Emergency Drugs available, Suction available and Patient being monitored Patient Re-evaluated:Patient Re-evaluated prior to inductionOxygen Delivery Method: Circle system utilized Preoxygenation: Pre-oxygenation with 100% oxygen Intubation Type: IV induction and Rapid sequence Ventilation: Two handed mask ventilation required Laryngoscope Size: Mac and 3 Grade View: Grade I Tube type: Oral Tube size: 8.0 mm Number of attempts: 1 Airway Equipment and Method: Stylet and Oral airway Placement Confirmation: ETT inserted through vocal cords under direct vision,  positive ETCO2 and breath sounds checked- equal and bilateral Secured at: 20 cm Tube secured with: Tape Dental Injury: Teeth and Oropharynx as per pre-operative assessment

## 2017-02-18 NOTE — Anesthesia Postprocedure Evaluation (Signed)
Anesthesia Post Note  Patient: Jordan Mcbride  Procedure(s) Performed: Procedure(s) (LRB): REPAIR UMBILICAL HERNIA WITH MESH (N/A) INSERTION OF MESH (N/A)  Patient location during evaluation: PACU Anesthesia Type: General Level of consciousness: awake and alert Pain management: pain level controlled Vital Signs Assessment: post-procedure vital signs reviewed and stable Respiratory status: spontaneous breathing, nonlabored ventilation and respiratory function stable Cardiovascular status: blood pressure returned to baseline and stable Postop Assessment: no signs of nausea or vomiting Anesthetic complications: no       Last Vitals:  Vitals:   02/18/17 1100 02/18/17 1119  BP: (!) 154/94   Pulse: 68 70  Resp: 18 14  Temp:      Last Pain:  Vitals:   02/18/17 1100  TempSrc:   PainSc: [place]

## 2017-02-18 NOTE — Interval H&P Note (Signed)
History and Physical Interval Note:  02/18/2017 7:52 AM  Jordan Mcbride  has presented today for surgery, with the diagnosis of Umbilical hernia  The various methods of treatment have been discussed with the patient and family. After consideration of risks, benefits and other options for treatment, the patient has consented to  Procedure(s): REPAIR UMBILICAL HERNIA WITH MESH (N/A) INSERTION OF MESH (N/A) as a surgical intervention .  The patient's history has been reviewed, patient examined, no change in status, stable for surgery.  I have reviewed the patient's chart and labs.  Questions were answered to the patient's satisfaction.     [name]

## 2017-02-19 ENCOUNTER — Encounter (HOSPITAL_BASED_OUTPATIENT_CLINIC_OR_DEPARTMENT_OTHER): Payer: Self-pay | Admitting: General Surgery

## 2017-07-14 ENCOUNTER — Ambulatory Visit (INDEPENDENT_AMBULATORY_CARE_PROVIDER_SITE_OTHER): Payer: Medicaid Other | Admitting: Obstetrics & Gynecology

## 2017-07-14 ENCOUNTER — Encounter: Payer: Self-pay | Admitting: Obstetrics & Gynecology

## 2017-07-14 ENCOUNTER — Other Ambulatory Visit (HOSPITAL_COMMUNITY)
Admission: RE | Admit: 2017-07-14 | Discharge: 2017-07-14 | Disposition: A | Discharge status: Home / Self Care | Payer: Medicaid Other | Source: Ambulatory Visit | Attending: Obstetrics & Gynecology | Admitting: Obstetrics & Gynecology

## 2017-07-14 VITALS — BP 135/90 | HR 82 | Ht 65.0 in | Wt 254.0 lb

## 2017-07-14 DIAGNOSIS — N912 Amenorrhea, unspecified: Secondary | ICD-10-CM | POA: Insufficient documentation

## 2017-07-14 DIAGNOSIS — N898 Other specified noninflammatory disorders of vagina: Secondary | ICD-10-CM | POA: Insufficient documentation

## 2017-07-14 DIAGNOSIS — Z113 Encounter for screening for infections with a predominantly sexual mode of transmission: Secondary | ICD-10-CM | POA: Insufficient documentation

## 2017-07-14 DIAGNOSIS — E221 Hyperprolactinemia: Secondary | ICD-10-CM | POA: Diagnosis not present

## 2017-07-14 NOTE — Progress Notes (Signed)
Patient ID: Jordan Mcbride, female   DOB: Feb 27, 1971, 46 y.o.   MRN: [id number]  Chief Complaint  Patient presents with  . Gynecologic Exam  no menses 5 months  HPI Jordan Mcbride is a 46 y.o. female.  [id number] 5 months since last menses. She was treated for elevated PRL and stopped her medication last year. Her endocrinologist checked her level recently and the result was high she says. No refill for bromocriptine was given. No BCM used as she would like to conceive  HPI  Past Medical History:  Diagnosis Date  . 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 Ovarian cyst   . Umbilical hernia 9/37/9024    Past Surgical History:  Procedure Laterality Date  . EYE SURGERY     removed eyelid cyst  . INSERTION OF MESH N/A 02/18/2017   Procedure: INSERTION OF MESH;  Surgeon: Fanny Skates, MD;  Location: [facility];  Service: General;  Laterality: N/A;  . UMBILICAL HERNIA REPAIR N/A 02/18/2017   Procedure: REPAIR UMBILICAL HERNIA WITH MESH;  Surgeon: Fanny Skates, MD;  Location: [facility];  Service: General;  Laterality: N/A;    History reviewed. No pertinent family history.  Social History Social History  Substance Use Topics  . Smoking status: Never Smoker  . Smokeless tobacco: Never Used  . Alcohol use Yes     Comment: socially    No Known Allergies  Current Outpatient Prescriptions  Medication Sig Dispense Refill  . acetaminophen (TYLENOL) 325 MG tablet Take 2 tablets (650 mg total) by mouth every 6 (six) hours as needed. 90 tablet 0  . HYDROcodone-acetaminophen (NORCO) 5-325 MG tablet Take 1-2 tablets by mouth every 6 (six) hours as needed for moderate pain or severe pain. 30 tablet 0  . meloxicam (MOBIC) 7.5 MG tablet TK 1 T PO BID WF PRN P  2   No current facility-administered medications for this visit.    OB History    Gravida Para Term Preterm AB Living   5 3 3   2 3    SAB TAB Ectopic Multiple Live Births   2              Her youngest child is 10 Review of Systems Review of Systems  Constitutional: Negative.   Respiratory: Negative.   Gastrointestinal: Negative for abdominal distention, abdominal pain and constipation.  Endocrine: Negative.   Genitourinary: Positive for menstrual problem. Negative for vaginal bleeding and vaginal discharge.  Neurological: Positive for headaches.    Blood pressure 135/90, pulse 82, height 5\' 5"  (1.651 m), weight 254 lb (115.2 kg).  Physical Exam Physical Exam  Constitutional: She is oriented to person, place, and time. She appears well-developed. No distress.  obese  Eyes: Pupils are equal, round, and reactive to light.  Neck: Normal range of motion.  Cardiovascular: Normal rate.   Pulmonary/Chest: Effort normal. No respiratory distress.  Abdominal: Soft. She exhibits no mass. There is no tenderness. There is no guarding.  Surgical scars noted  Genitourinary: Vagina normal and uterus normal. No vaginal discharge found.  Genitourinary Comments: No mass, pap done  Neurological: She is alert and oriented to person, place, and time.  Skin: Skin is warm and dry.  Psychiatric: She has a normal mood and affect. Her behavior is normal.  Vitals reviewed. Breasts: breasts appear normal, no suspicious masses, no skin or nipple changes or axillary nodes.   Data Reviewed Pap from 2013, CT result and [place]. No PRL  seen *RADIOLOGY REPORT*  Clinical Data: 46 year old female visual changes.  History of pituitary microadenoma.  MRI HEAD WITHOUT AND WITH CONTRAST  Technique:  Multiplanar, multiecho pulse sequences of the brain and surrounding structures were obtained according to standard protocol without and with intravenous contrast  Contrast: 29mL MULTIHANCE GADOBENATE DIMEGLUMINE 529 MG/ML IV SOLN  Comparison: 10/28/2010.  Findings: Normal cerebral volume. No restricted diffusion to suggest acute infarction.  No midline shift, mass effect, evidence of mass  lesion, ventriculomegaly, extra-axial collection or acute intracranial hemorrhage.  Cervicomedullary junction is within normal limits.  Major intracranial vascular flow voids are preserved.  Stable gray-white matter signal; mild for age occasional small nonspecific white matter foci of T2 and FLAIR hyperintensity. Visualized orbit soft tissues are within normal limits.  Negative visualized cervical spine. Visualized bone marrow signal is within normal limits.  Visualized paranasal sinuses and mastoids are clear.  Negative scalp soft tissues.  Dedicated pituitary imaging.  Continued nodular hypoenhancing area in the central inferior aspect of the sella turcica measuring 8 x 9 x 11 mm (previously 8 x 10 x 10 mm).  There is subsequently a convex upper margin of the more homogeneously enhancing pituitary gland which appears primarily displaced upwards.  There is effacement of the optic chiasm without significant mass effect. The infundibulum is within normal limits.  The hypothalamus is within normal limits.  No other suprasellar mass effect or mass. Cavernous sinuses do not appear affected.  No other abnormal enhancement.  IMPRESSION: 1.  No significant change in the central pituitary adenoma measuring approximately 8 x 9 x 11 mm. 2.  Stable effacement of the optic chiasm and suprasellar cistern but no progressive mass effect. 3.  Stable MRI appearance of the brain.   Original Report Authenticated By: [name], M.D. Assessment    Well woman exam Irregular menses possibly related to PRL Suspect perimenopause Needs mammogram    Plan    F/U with her MD re prolactin and bromocriptine Mammogram FSH and HCG RTC 6 months       Emeterio Reeve 07/14/2017, 1:51 PM

## 2017-07-14 NOTE — Patient Instructions (Signed)
Secondary Amenorrhea Secondary amenorrhea is the stopping of menstrual flow for 3-6 months in a female who has previously had periods. There are many possible causes. Most of these causes are not serious. Usually, treating the underlying problem causing the loss of menses will return your periods to normal. What are the causes? Some common and uncommon causes of not menstruating include:  Malnutrition.  Low blood sugar (hypoglycemia).  Polycystic ovary disease.  Stress or fear.  Breastfeeding.  Hormone imbalance.  Ovarian failure.  Medicines.  Extreme obesity.  Cystic fibrosis.  Low body weight or drastic weight reduction from any cause.  Early menopause.  Removal of ovaries or uterus.  Contraceptives.  Illness.  Long-term (chronic) illnesses.  Cushing syndrome.  Thyroid problems.  Birth control pills, patches, or vaginal rings for birth control.  What increases the risk? You may be at greater risk of secondary amenorrhea if:  You have a family history of this condition.  You have an eating disorder.  You do athletic training.  How is this diagnosed? A diagnosis is made by your health care provider taking a medical history and doing a physical exam. This will include a pelvic exam to check for problems with your reproductive organs. Pregnancy must be ruled out. Often, numerous blood tests are done to measure different hormones in the body. Urine testing may be done. Specialized exams (ultrasound, CT scan, MRI, or hysteroscopy) may have to be done as well as measuring the body mass index (BMI). How is this treated? Treatment depends on the cause of the amenorrhea. If an eating disorder is present, this can be treated with an adequate diet and therapy. Chronic illnesses may improve with treatment of the illness. Amenorrhea may be corrected with medicines, lifestyle changes, or surgery. If the amenorrhea cannot be corrected, it is sometimes possible to create a  false menstruation with medicines. Follow these instructions at home:  Maintain a healthy diet.  Manage weight problems.  Exercise regularly but not excessively.  Get adequate sleep.  Manage stress.  Be aware of changes in your menstrual cycle. Keep a record of when your periods occur. Note the date your period starts, how long it lasts, and any problems. Contact a health care provider if: Your symptoms do not get better with treatment. This information is not intended to replace advice given to you by your health care provider. Make sure you discuss any questions you have with your health care provider. Document Released: 10/27/2006 Document Revised: 02/21/2016 Document Reviewed: 03/03/2013 Elsevier Interactive Patient Education  2018 [employer]vier Inc[employer]  

## 2017-07-15 LAB — CERVICOVAGINAL ANCILLARY ONLY
BACTERIAL VAGINITIS: NEGATIVE
CANDIDA VAGINITIS: NEGATIVE
CHLAMYDIA, DNA PROBE: NEGATIVE
Neisseria Gonorrhea: NEGATIVE
Trichomonas: NEGATIVE

## 2017-07-15 LAB — BETA HCG QUANT (REF LAB)

## 2017-07-15 LAB — FOLLICLE STIMULATING HORMONE: FSH: 9 m[IU]/mL

## 2017-07-16 LAB — CYTOLOGY - PAP
DIAGNOSIS: NEGATIVE
HPV (WINDOPATH): NOT DETECTED

## 2017-10-13 ENCOUNTER — Ambulatory Visit (INDEPENDENT_AMBULATORY_CARE_PROVIDER_SITE_OTHER): Payer: Medicaid Other | Admitting: Internal Medicine

## 2017-10-13 ENCOUNTER — Encounter (INDEPENDENT_AMBULATORY_CARE_PROVIDER_SITE_OTHER): Payer: Self-pay

## 2017-10-13 ENCOUNTER — Encounter: Payer: Self-pay | Admitting: Internal Medicine

## 2017-10-13 VITALS — BP 140/97 | HR 76 | Ht 65.0 in | Wt 255.4 lb

## 2017-10-13 DIAGNOSIS — D352 Benign neoplasm of pituitary gland: Secondary | ICD-10-CM | POA: Diagnosis not present

## 2017-10-13 DIAGNOSIS — E221 Hyperprolactinemia: Secondary | ICD-10-CM | POA: Diagnosis not present

## 2017-10-13 NOTE — Patient Instructions (Signed)
Please schedule another pituitary MRI.  Please stop at the lab.  If Prolactin is still high, I would suggest to start Cabergoline 0.25 mg every other day.  Come back for labs in 1.5 months for a repeat level.  Please come back for a follow-up appointment in 6 months.

## 2017-10-13 NOTE — Progress Notes (Signed)
Patient ID: Jordan Mcbride, female   DOB: [birth year], 47 y.o.   MRN: [id number]    HPI  Jordan Mcbride is a 47 y.o.-year-old female, referred by her PCP, Dr. Jeanie Cooks, for evaluation for pituitary adenoma and hyperprolactinemia.  She saw endocrinology before.  Pt. has been dx with hyperprolactinemia in 2001 during investigation for infertility. She was found at that time to have a pituitary adenoma. She was started on Bromocriptine since then until 2017 when she changed to cabergoline: 1 tab 2x a day (!) >> started to experience numbness in face, eyelid skin changes>> was on it for 1 year >> then back on Bromocriptine 2.5 mg a day >> ran out 10/2016.  Pituitary MRI (06/17/2012): Stable central pituitary adenoma of 8 x 9 x 11 mm (previously 8 x 10 x 10 mm) pituitary gland appears to be displaced upwards,  with effacement of the optic chiasm but without significant mass-effect.  Reviewed available prolactin levels: 06/08/2017: Prolactin 191.5  Other pertinent labs: 06/08/2017: LH 2.6, FSH 11.8, TSH 1.49  She is interested in getting pregnant again.  Pt c/o: - no menses since 12/2016 - + galactorrhea when pressing R breast - + visual field defects - + infertility - currently - + occasionally headaches - + weight gain (6 bs since last year) - no acne - no hirsutiam - + fatigue - no constipation - no dry skin - no hair loss - + joint pain  Pt. also has a history of umbilical hernia - had Sx 2018.  ROS: Constitutional: + weight gain/loss, + fatigue, + subjective hypothermia, + poor sleep Eyes: + Blurry vision, no xerophthalmia ENT: no sore throat, + nodules palpated in throat, no dysphagia/odynophagia, no hoarseness Cardiovascular: no CP/+ SOB/no palpitations/+ leg swelling Respiratory: no cough/+ SOB Gastrointestinal: no N/V/D/C/+ occasional heartburn Musculoskeletal: + Both: Muscle/joint aches Skin: no rashes Neurological: no tremors/numbness/tingling/dizziness, + occasional  headache Psychiatric: no depression/anxiety + see HPI  Past Medical History:  Diagnosis Date  . Brain tumor ([place])    micro-pituitary adenoma  . Ovarian cyst   . Umbilical hernia 03/05/3709   Past Surgical History:  Procedure Laterality Date  . EYE SURGERY     removed eyelid cyst  . INSERTION OF MESH N/A 02/18/2017   Procedure: INSERTION OF MESH;  Surgeon: Fanny Skates, MD;  Location: [place];  Service: General;  Laterality: N/A;  . UMBILICAL HERNIA REPAIR N/A 02/18/2017   Procedure: REPAIR UMBILICAL HERNIA WITH MESH;  Surgeon: Fanny Skates, MD;  Location: [place];  Service: General;  Laterality: N/A;   Social History   Socioeconomic History  . Marital status: Single    Spouse name: Not on file  . Number of children: 4        Occupational History  . braider  Tobacco Use  . Smoking status: Never Smoker  . Smokeless tobacco: Never Used  Substance and Sexual Activity  . Alcohol use: Yes    Comment: socially  . Drug use: No   Current Outpatient Medications on File Prior to Visit  Medication Sig Dispense Refill  . cetirizine (ZYRTEC) 10 MG tablet Take 10 mg by mouth daily.    Marland Kitchen DICLOFENAC SODIUM PO Take 75 mg by mouth 2 (two) times daily.     No current facility-administered medications on file prior to visit.    No Known Allergies   No family history on file.  PE: BP (!) 140/97   Pulse 76   Ht 5\' 5"  (1.651  m)   Wt 255 lb 6.4 oz (115.8 kg)   SpO2 95%   BMI 42.50 kg/m  Wt Readings from Last 3 Encounters:  10/13/17 255 lb 6.4 oz (115.8 kg)  07/14/17 254 lb (115.2 kg)  02/18/17 249 lb 9.6 oz (113.2 kg)   Constitutional: obese, in NAD Eyes: PERRLA, EOMI, no exophthalmos ENT: moist mucous membranes, no thyromegaly, no cervical lymphadenopathy Cardiovascular: RRR, No MRG Respiratory: CTA B Gastrointestinal: abdomen soft, NT, ND, BS+ Musculoskeletal: no deformities, strength intact in all 4 Skin: moist, warm, no  rashes Neurological: no tremor with outstretched hands, DTR normal in all 4  ASSESSMENT: 1. Hyperprolactinemia  2. Pituitary adenoma  PLAN:  1. Patient with high prolactin level and a history of pituitary microadenoma since 2001, stable in size per last thyroid ultrasound from 2013.  She has been on bromocriptine in the past for many years and then tried cabergoline but at a very high dose, of 1 tablet twice a day, which she could not tolerate due to dizziness.  She did take it for approximately 1 year, after which she switched back to bromocriptine, but ran out of this in 10/2016.  2 months later, she stopped having menstrual cycles and she slowly started to have galactorrhea. - At this visit, we reviewed the report of her 2013 pituitary MRI that shows a pituitary adenoma, measuring 1.1 cm in the largest dimension  - we also reviewed her most recent prolactin level, which was very high, at 191 - I explained that she does need treatment for this as she has amenorrhea, galactorrhea, and also she would like to become pregnant - We discussed that cabergoline is better than bromocriptine in the sense that it is taken more rarely, and he can also reduce the size of her pituitary tumor, not only decrease the prolactin level.  She agrees to restart cabergoline and we plan to use half a tablet every other day for now, with plan to reduce the dose after we check another level in 1.5 months. - I advised the patient to take cabergoline at night  2.  Pituitary adenoma - Please also see problem #1 - Since the last MRI that we have is from 2013, we discussed about checking another one, before starting cabergoline, as a starting point - She agrees to have this done - I advised her to only started cabergoline after she gets the MRI done  Orders Placed This Encounter  Procedures  . MR Brain W Wo Contrast  . Prolactin   Component     Latest Ref Rng & Units 10/14/2017  Prolactin     ng/mL 158.2 (H)    Started cabergoline 0.25 mg every other day and recheck her prolactin in 1.5 months, as discussed.  Details   Reading Physician Reading Date Result Priority  Barnet Glasgow, MD 10/18/2017     Narrative    CLINICAL DATA: Hyperprolactinemia. Pituitary adenoma follow-up  EXAM: MRI HEAD WITHOUT AND WITH CONTRAST  TECHNIQUE: Multiplanar, multiecho pulse sequences of the brain and surrounding structures were obtained without and with intravenous contrast.  CONTRAST: 33mL MULTIHANCE GADOBENATE DIMEGLUMINE 529 MG/ML IV SOLN  COMPARISON: MRI 06/17/2012, 10/28/2010  FINDINGS: Brain: Dedicated pituitary imaging. The patient difficulty holding still and there is some motion especially the dedicated pituitary images.  Hypoenhancing pituitary adenoma appear slightly larger than on prior studies. The hypoenhancing component of the lesion measures approximately 10 x 12 x 12 mm, compared with 9 x 7 x 11 mm previously. Enhancing tissue above  the hypoenhancing tissue likely is pituitary tissue which is flattening the inferior optic chiasm. No compression of the optic nerve. Infundibulum midline. Cavernous sinus normal.  Ventricle size normal. Negative for acute infarct. Scattered small white matter hyperintensities bilaterally appear progressive and most likely due to mild chronic microvascular ischemia. Negative for hemorrhage or fluid collection.  Vascular: Normal arterial flow voids.  Skull and upper cervical spine: Negative  Sinuses/Orbits: Negative  Other: None  IMPRESSION: Hypoenhancing pituitary adenoma shows interval growth, now measuring 10 x 12 x 12 mm. Mild flattening of the undersurface of the optic chiasm without compression of the chiasm.  Mild chronic microvascular ischemic changes in the white matter with progression from the prior study.   Electronically Signed By: Franchot Gallo M.D. On: 10/18/2017 16:07   We will start cabergoline as  discussed.   Philemon Kingdom, MD PhD [facility]

## 2017-10-14 ENCOUNTER — Other Ambulatory Visit: Payer: Medicaid Other

## 2017-10-14 DIAGNOSIS — E221 Hyperprolactinemia: Secondary | ICD-10-CM

## 2017-10-15 ENCOUNTER — Telehealth: Payer: Self-pay

## 2017-10-15 LAB — PROLACTIN: PROLACTIN: 158.2 ng/mL — AB

## 2017-10-15 MED ORDER — CABERGOLINE 0.5 MG PO TABS: 0.2500 mg | ORAL_TABLET | ORAL | 3 refills | Status: DC

## 2017-10-15 NOTE — Telephone Encounter (Signed)
LMTCB

## 2017-10-15 NOTE — Telephone Encounter (Signed)
-----   Message from Philemon Kingdom, MD sent at 10/15/2017  8:43 AM EST ----- Loma Sousa, can you please call pt: Prolactin level is still high, at 158.  Let us started cabergoline right after she has her pituitary MRI: Half a tablet every other day, as we discussed.  We need another prolactin level in 1.5 months (lab is in).

## 2017-10-18 ENCOUNTER — Ambulatory Visit
Admission: RE | Admit: 2017-10-18 | Discharge: 2017-10-18 | Disposition: A | Discharge status: Home / Self Care | Payer: Medicaid Other | Source: Ambulatory Visit | Attending: Internal Medicine | Admitting: Internal Medicine

## 2017-10-18 MED ORDER — GADOBENATE DIMEGLUMINE 529 MG/ML IV SOLN
13.0000 mL | Freq: Once | INTRAVENOUS | Status: AC | PRN
  Administered 2017-10-18: 13 mL via INTRAVENOUS

## 2017-10-21 ENCOUNTER — Encounter: Payer: Self-pay | Admitting: Internal Medicine

## 2017-12-21 ENCOUNTER — Other Ambulatory Visit: Payer: Medicaid Other

## 2017-12-21 DIAGNOSIS — D352 Benign neoplasm of pituitary gland: Secondary | ICD-10-CM

## 2017-12-21 DIAGNOSIS — E221 Hyperprolactinemia: Secondary | ICD-10-CM

## 2017-12-22 ENCOUNTER — Other Ambulatory Visit: Payer: Self-pay | Admitting: Internal Medicine

## 2017-12-22 LAB — PROLACTIN: Prolactin: 2.3 ng/mL

## 2017-12-22 MED ORDER — CABERGOLINE 0.5 MG PO TABS: ORAL_TABLET | ORAL | 3 refills | Status: DC

## 2018-04-12 ENCOUNTER — Ambulatory Visit (INDEPENDENT_AMBULATORY_CARE_PROVIDER_SITE_OTHER): Payer: Medicaid Other | Admitting: Internal Medicine

## 2018-04-12 VITALS — BP 128/80 | HR 70 | Ht 65.0 in | Wt 239.2 lb

## 2018-04-12 DIAGNOSIS — E221 Hyperprolactinemia: Secondary | ICD-10-CM | POA: Diagnosis not present

## 2018-04-12 DIAGNOSIS — D352 Benign neoplasm of pituitary gland: Secondary | ICD-10-CM | POA: Diagnosis not present

## 2018-04-12 NOTE — Progress Notes (Signed)
Patient ID: Jordan Mcbride, female   DOB: 08-13-1971, 47 y.o.   MRN: [id number]    HPI  Jordan Mcbride is a 47 y.o.-year-old female, returning for follow-up for pituitary adenoma and hyperprolactinemia.  Last visit 6 months ago.  Pt. has been dx with hyperprolactinemia  in 2001 during investigation for infertility . She was found at that time to have a pituitary adenoma. She was started on Bromocriptine since then until 2017 when she changed to cabergoline: 1 tab 2x a day (!) >> started to experience numbness in face, eyelid skin changes>> was on it for 1 year >> then back on Bromocriptine 2.5 mg a day >> ran out 10/2016.  Pituitary MRI (06/17/2012): Stable central pituitary adenoma of 8 x 9 x 11 mm (previously 8 x 10 x 10 mm) pituitary gland appears to be displaced upwards,  with effacement of the optic chiasm but without significant mass-effect.  Pituitary MRI (10/18/2017): Hypoenhancing pituitary adenoma shows interval growth, now measuring 10 x 12 x 12 mm. Mild flattening of the undersurface of the optic chiasm without compression of the chiasm.  Reviewed prolactin levels: Lab Results  Component Value Date   PROLACTIN 2.3 12/21/2017   PROLACTIN 158.2 (H) 10/14/2017  06/08/2017: Prolactin 191.5  Other pertinent labs: 06/08/2017: LH 2.6, FSH 11.8, TSH 1.49  At last visit, she was telling me that she was interested in getting pregnant again.  We started cabergoline 0.25 mg every other day initially, 6 months ago.  We rechecked her prolactin level in 11/2017 and her prolactin decreased significantly so we decreased her cabergoline dose to 0.25 mg twice a week.  She continues on this without side effects.  She denies: - galactorrhea;  at last visit, she was having galactorrhea when pressing on the right breast - Significant headaches - Congestion  But does have visual field defects, infertility, occasional headaches  - better.  Pt. also has a history of umbilical hernia - had Sx  2018.  ROS: Constitutional: no weight gain/no weight loss, no fatigue, no subjective hyperthermia, no subjective hypothermia Eyes: no blurry vision, no xerophthalmia ENT: no sore throat, no nodules palpated in throat, no dysphagia, no odynophagia, no hoarseness Cardiovascular: no CP/no SOB/no palpitations/no leg swelling Respiratory: no cough/no SOB/no wheezing Gastrointestinal: no N/no V/no D/no C/no acid reflux Musculoskeletal: no muscle aches/no joint aches Skin: no rashes, no hair loss Neurological: no tremors/no numbness/no tingling/no dizziness  I reviewed pt's medications, allergies, PMH, social hx, family hx, and changes were documented in the history of present illness. Otherwise, unchanged from my initial visit note.  Past Medical History:  Diagnosis Date  . Brain tumor ([place])    micro-pituitary adenoma  . Ovarian cyst   . Umbilical hernia 0/35/0093   Past Surgical History:  Procedure Laterality Date  . EYE SURGERY     removed eyelid cyst  . INSERTION OF MESH N/A 02/18/2017   Procedure: INSERTION OF MESH;  Surgeon: Fanny Skates, MD;  Location: [place];  Service: General;  Laterality: N/A;  . UMBILICAL HERNIA REPAIR N/A 02/18/2017   Procedure: REPAIR UMBILICAL HERNIA WITH MESH;  Surgeon: Fanny Skates, MD;  Location: [place];  Service: General;  Laterality: N/A;   Social History   Socioeconomic History  . Marital status: Single    Spouse name: Not on file  . Number of children: 4        Occupational History  . braider  Tobacco Use  . Smoking status: Never Smoker  .  Smokeless tobacco: Never Used  Substance and Sexual Activity  . Alcohol use: Yes    Comment: socially  . Drug use: No   Current Outpatient Medications on File Prior to Visit  Medication Sig Dispense Refill  . cabergoline (DOSTINEX) 0.5 MG tablet Take 0.25 mg twice a week by mouth 20 tablet 3  . cetirizine (ZYRTEC) 10 MG tablet Take 10 mg by mouth daily.     Marland Kitchen DICLOFENAC SODIUM PO Take 75 mg by mouth 2 (two) times daily.     No current facility-administered medications on file prior to visit.    No Known Allergies   No family history on file.  PE: BP 128/80   Pulse 70   Ht 5\' 5"  (1.651 m)   Wt 239 lb 3.2 oz (108.5 kg)   SpO2 97%   BMI 39.80 kg/m  Wt Readings from Last 3 Encounters:  04/12/18 239 lb 3.2 oz (108.5 kg)  10/13/17 255 lb 6.4 oz (115.8 kg)  07/14/17 254 lb (115.2 kg)   Constitutional: overweight, in NAD Eyes: PERRLA, EOMI, no exophthalmos ENT: moist mucous membranes, no thyromegaly, no cervical lymphadenopathy Cardiovascular: RRR, No MRG Respiratory: CTA B Gastrointestinal: abdomen soft, NT, ND, BS+ Musculoskeletal: no deformities, strength intact in all 4 Skin: moist, warm, no rashes Neurological: no tremor with outstretched hands, DTR normal in all 4  ASSESSMENT: 1. Hyperprolactinemia  2. Pituitary adenoma  PLAN:  1. Patient with history of high prolactin level and a history of pituitary microadenoma since 2001, increased at last check in 09/2017.  She has been on bromocriptine in the past for many years and then tried cabergoline, but that the very high dose, of 1 tablet twice a day, which she could not tolerate due to dizziness.  She did try to take it for about a year, but she then switched back to bromocriptine and ran out of this in 10/2016.  2 months later, she stopped having menstrual cycles and she slowly started to have galactorrhea.  At last visit, in 09/2017, her prolactin was high, at 158.2 and we started cabergoline at 0.25 mg every other day.  The subsequent prolactin level was excellent, at 2.3, so we were able to decrease the dose of cabergoline to 0.25 mg twice a week.  She is now taking this dose without side effects and has no galactorrhea or increased headaches. - We discussed that we will check a prolactin level at this visit and if still controlled, I will advise her to decrease the dose to only  once a week. - I advised her that if she does become pregnant, she needs to start cabergoline right away and let me know - I will see her back in a year, however, if we decrease the dose of cabergoline, she will need to come back for another prolactin level in 2 months  2.  Pituitary adenoma - Please also see problem #1 - Reviewed latest MRI report from 09/2017: Her pituitary adenoma appears larger - we started cabergoline after the results of the MRI returned, initially at 0.25 mg every other day, but, as her prolactin level improved significantly, we were able to decrease the dose to only twice a week.  She continues this now.  No side effects from the medication.  Also, she does not notice any galactorrhea, and her headaches have improved. - No further hormonal investigation needed for now and I do not feel that she needs another MRI for now.  Component     Latest  Ref Rng & Units 04/12/2018  Prolactin     ng/mL 9.0   Prolactin level is great, so we can decrease the cabergoline dose to once a week. We will have her back for another prolactin level in 2 months.  Philemon Kingdom, MD PhD [facility] Endocr[facility]

## 2018-04-12 NOTE — Patient Instructions (Signed)
Please continue Cabergoline 0.25 mg 2x a week.  Please stop at the lab.  If we change te Cabergolin dose, please come back for labs in 2 months.  If you get pregnant, stop Cabergoline right away and let me know.  Please come back for a follow-up appointment in 1 year.

## 2018-04-13 ENCOUNTER — Encounter: Payer: Self-pay | Admitting: Internal Medicine

## 2018-04-13 LAB — PROLACTIN: PROLACTIN: 9 ng/mL

## 2018-04-13 MED ORDER — CABERGOLINE 0.5 MG PO TABS: ORAL_TABLET | ORAL | 3 refills | Status: DC

## 2018-05-05 ENCOUNTER — Other Ambulatory Visit: Payer: Self-pay | Admitting: Ophthalmology

## 2018-06-02 ENCOUNTER — Other Ambulatory Visit: Payer: Self-pay | Admitting: Ophthalmology

## 2018-07-19 ENCOUNTER — Other Ambulatory Visit: Payer: Self-pay

## 2018-07-19 ENCOUNTER — Emergency Department (HOSPITAL_COMMUNITY)
Admission: EM | Admit: 2018-07-19 | Discharge: 2018-07-20 | Disposition: A | Discharge status: Home / Self Care | Payer: Medicaid Other | Attending: Emergency Medicine | Admitting: Emergency Medicine

## 2018-07-19 ENCOUNTER — Encounter (HOSPITAL_COMMUNITY): Payer: Self-pay | Admitting: Emergency Medicine

## 2018-07-19 DIAGNOSIS — K2900 Acute gastritis without bleeding: Secondary | ICD-10-CM | POA: Diagnosis not present

## 2018-07-19 DIAGNOSIS — Z79899 Other long term (current) drug therapy: Secondary | ICD-10-CM | POA: Insufficient documentation

## 2018-07-19 DIAGNOSIS — B349 Viral infection, unspecified: Secondary | ICD-10-CM | POA: Diagnosis not present

## 2018-07-19 DIAGNOSIS — R1013 Epigastric pain: Secondary | ICD-10-CM | POA: Diagnosis present

## 2018-07-19 LAB — COMPREHENSIVE METABOLIC PANEL
ALBUMIN: 3.7 g/dL (ref 3.5–5.0)
ALK PHOS: 84 U/L (ref 38–126)
ALT: 11 U/L (ref 0–44)
ANION GAP: 7 (ref 5–15)
AST: 15 U/L (ref 15–41)
BUN: 12 mg/dL (ref 6–20)
CO2: 24 mmol/L (ref 22–32)
Calcium: 8.6 mg/dL — ABNORMAL LOW (ref 8.9–10.3)
Chloride: 108 mmol/L (ref 98–111)
Creatinine, Ser: 1.01 mg/dL — ABNORMAL HIGH (ref 0.44–1.00)
GFR calc non Af Amer: >60 mL/min (ref >60)
GLUCOSE: 87 mg/dL (ref 70–99)
Potassium: 3.5 mmol/L (ref 3.5–5.1)
SODIUM: 139 mmol/L (ref 135–145)
Total Bilirubin: 0.6 mg/dL (ref 0.3–1.2)
Total Protein: 6.5 g/dL (ref 6.5–8.1)

## 2018-07-19 LAB — URINALYSIS, ROUTINE W REFLEX MICROSCOPIC
Bilirubin Urine: NEGATIVE
Glucose, UA: NEGATIVE mg/dL
Hgb urine dipstick: NEGATIVE
KETONES UR: NEGATIVE mg/dL
Leukocytes, UA: NEGATIVE
Nitrite: NEGATIVE
Protein, ur: NEGATIVE mg/dL
Specific Gravity, Urine: 1.025 (ref 1.005–1.030)
pH: 6 (ref 5.0–8.0)

## 2018-07-19 LAB — CBC
HCT: 36.4 % (ref 36.0–46.0)
Hemoglobin: 11.3 g/dL — ABNORMAL LOW (ref 12.0–15.0)
MCH: 27 pg (ref 26.0–34.0)
MCHC: 31 g/dL (ref 30.0–36.0)
MCV: 86.9 fL (ref 80.0–100.0)
NRBC: 0 % (ref 0.0–0.2)
Platelets: 243 10*3/uL (ref 150–400)
RBC: 4.19 MIL/uL (ref 3.87–5.11)
RDW: 14.5 % (ref 11.5–15.5)
WBC: 4.8 10*3/uL (ref 4.0–10.5)

## 2018-07-19 LAB — I-STAT BETA HCG BLOOD, ED (MC, WL, AP ONLY)

## 2018-07-19 LAB — LIPASE, BLOOD: LIPASE: 40 U/L (ref 11–51)

## 2018-07-19 NOTE — ED Provider Notes (Signed)
Patient placed in Quick Look pathway, seen and evaluated   Chief Complaint: abdominal pain  HPI: Jordan Mcbride is a 47 y.o. female who presents to the ED with abdominal pain. Patient reports nausea and vomiting.  She states that she vomited yesterday, not today, but she does have quite a bit of nausea today. Patient denies diarrhea or fever.  ROS: GI: abdominal pain.  Physical Exam:  BP (!) 141/92 (BP Location: Right Arm)   Pulse 74   Temp 98.6 F (37 C) (Oral)   Resp 17   LMP 07/10/2018 (Approximate)   SpO2 100%    Gen: No distress  Neuro: Awake and Alert  Skin: Warm and dry  Abdomen: soft, tender with palpation epigastric area  Initiation of care has begun. The patient has been counseled on the process, plan, and necessity for staying for the completion/evaluation, and the remainder of the medical screening examination    Ashley Murrain, NP 07/19/18 Ladell Heads, MD 07/20/18 [id number]

## 2018-07-19 NOTE — ED Triage Notes (Signed)
Patient with abdominal pain, with nausea and vomiting.  She states that she vomited yesterday, not today, but she does have quite a bit of nausea today.

## 2018-07-20 ENCOUNTER — Emergency Department (HOSPITAL_COMMUNITY): Payer: Medicaid Other

## 2018-07-20 LAB — I-STAT CG4 LACTIC ACID, ED: LACTIC ACID, VENOUS: 0.5 mmol/L (ref 0.5–1.9)

## 2018-07-20 LAB — TROPONIN I

## 2018-07-20 MED ORDER — ONDANSETRON 4 MG PO TBDP: 4.0000 mg | ORAL_TABLET | Freq: Three times a day (TID) | ORAL | 0 refills | Status: DC | PRN

## 2018-07-20 MED ORDER — IOHEXOL 300 MG/ML  SOLN
100.0000 mL | Freq: Once | INTRAMUSCULAR | Status: AC | PRN
  Administered 2018-07-20: 100 mL via INTRAVENOUS

## 2018-07-20 MED ORDER — ONDANSETRON HCL 4 MG/2ML IJ SOLN
4.0000 mg | Freq: Once | INTRAMUSCULAR | Status: AC
  Administered 2018-07-20: 4 mg via INTRAVENOUS
  Filled 2018-07-20: qty 2

## 2018-07-20 MED ORDER — MORPHINE SULFATE (PF) 4 MG/ML IV SOLN
4.0000 mg | Freq: Once | INTRAVENOUS | Status: AC
  Administered 2018-07-20: 4 mg via INTRAVENOUS
  Filled 2018-07-20: qty 1

## 2018-07-20 NOTE — ED Provider Notes (Signed)
[facility] EMERGENCY DEPARTMENT Provider Note   CSN: [id number] Arrival date & time: 07/19/18  1938     History   Chief Complaint Chief Complaint  Patient presents with  . Abdominal Pain  . Nausea    HPI JANEICE STEGALL is a 47 y.o. female.  Patient presents with epigastric abdominal pain that started yesterday, associated with nausea and vomiting. No diarrhea or fever. No history of similar pain. At its worst, it radiates to chest as sharp pain. It is worse when she eats anything. No regular use of NSAIDs. She has tried Maalox for symptoms without relief. No urinary symptoms, SOB, constipation.  The history is provided by the patient. No language interpreter was used.    Past Medical History:  Diagnosis Date  . 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 Ovarian cyst   . Umbilical hernia 06/14/3845    Patient Active Problem List   Diagnosis Date Noted  . 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 Hyperprolactinemia ([place]) 07/14/2017  . Morbid obesity ([place]) 07/14/2017  . Umbilical hernia 65/99/3570  . Pelvic pain in female 11/08/2012  . Internal hemorrhoids 02/19/2012    Past Surgical History:  Procedure Laterality Date  . EYE SURGERY     removed eyelid cyst  . INSERTION OF MESH N/A 02/18/2017   Procedure: INSERTION OF MESH;  Surgeon: Fanny Skates, MD;  Location: [facility];  Service: General;  Laterality: N/A;  . UMBILICAL HERNIA REPAIR N/A 02/18/2017   Procedure: REPAIR UMBILICAL HERNIA WITH MESH;  Surgeon: Fanny Skates, MD;  Location: [facility];  Service: General;  Laterality: N/A;     OB History    Gravida  5   Para  3   Term  3   Preterm      AB  2   Living  3     SAB  2   TAB      Ectopic      Multiple      Live Births               Home Medications    Prior to Admission medications   Medication Sig Start Date End Date Taking? Authorizing Provider  cabergoline (DOSTINEX) 0.5 MG  tablet Take 0.25 mg once a week by mouth 04/13/18  Yes Philemon Kingdom, MD  cetirizine (ZYRTEC) 10 MG tablet Take 10 mg by mouth daily.   Yes [provider]  diclofenac (VOLTAREN) 75 MG EC tablet Take 75 mg by mouth 2 (two) times daily. 07/09/18  Yes [provider]    Family History No family history on file.  Social History Social History   Tobacco Use  . Smoking status: Never Smoker  . Smokeless tobacco: Never Used  Substance Use Topics  . Alcohol use: Yes    Comment: socially  . Drug use: No     Allergies   Patient has no known allergies.   Review of Systems Review of Systems  Constitutional: Negative for chills and fever.  Respiratory: Negative.  Negative for cough and shortness of breath.   Cardiovascular: Negative.   Gastrointestinal: Positive for abdominal pain, nausea and vomiting. Negative for diarrhea.  Genitourinary: Negative.   Musculoskeletal: Negative.  Negative for back pain.  Skin: Negative.   Neurological: Negative.      Physical Exam Updated Vital Signs BP (!) 137/96 (BP Location: Right Arm)   Pulse 67   Temp 99.3 F (37.4 C) (Oral)   Resp  18   LMP 07/10/2018 (Approximate)   SpO2 100%   Physical Exam  Constitutional: She is oriented to person, place, and time. She appears well-developed and well-nourished. She does not appear ill.  Cardiovascular: Normal rate.  Pulmonary/Chest: Effort normal. No respiratory distress.  Abdominal: Bowel sounds are normal. She exhibits no distension. There is tenderness in the epigastric area. There is guarding. There is negative Murphy's sign.  Neurological: She is alert and oriented to person, place, and time.  Skin: Skin is warm and dry.  Nursing note and vitals reviewed.    ED Treatments / Results  Labs (all labs ordered are listed, but only abnormal results are displayed) Labs Reviewed  COMPREHENSIVE METABOLIC PANEL - Abnormal; Notable for the following components:      Result  Value   Creatinine, Ser 1.01 (*)    Calcium 8.6 (*)    All other components within normal limits  CBC - Abnormal; Notable for the following components:   Hemoglobin 11.3 (*)    All other components within normal limits  LIPASE, BLOOD  URINALYSIS, ROUTINE W REFLEX MICROSCOPIC  I-STAT BETA HCG BLOOD, ED ([facility], [facility], [facility] ONLY)    EKG None  Radiology No results found.  Procedures Procedures (including critical care time)  Medications Ordered in ED Medications - No data to display   Initial Impression / Assessment and Plan / ED Course  I have reviewed the triage vital signs and the nursing notes.  Pertinent labs & imaging results that were available during my care of the patient were reviewed by me and considered in my medical decision making (see chart for details).     Patient with epigastric abdominal since yesterday with nausea and vomiting. No fever. Pain radiates to chest at times.   Labs are reassuring without leukocytosis, LFT abnormality. She has significant tenderness limited to epigastrium, without RUQ tenderness. She is seen and evaluated by Dr. Wyvonnia Dusky.  Will obtain CT for further evaluation.  Patient care signed out to Alyse Low, PA-C, at end of shift pending review of CT results and re-evaluation of patient's symptoms.   Final Clinical Impressions(s) / ED Diagnoses   Final diagnoses:  None   1. Abdominal pain.  ED Discharge Orders    None       Charlann Lange, Hershal Coria 07/20/18 5027    Ezequiel Essex, MD 07/20/18 [id number]

## 2018-07-20 NOTE — Discharge Instructions (Signed)
Return if any problems.

## 2018-10-06 ENCOUNTER — Encounter: Payer: Self-pay | Admitting: Obstetrics & Gynecology

## 2018-10-06 ENCOUNTER — Ambulatory Visit: Payer: Medicaid Other | Admitting: Obstetrics & Gynecology

## 2018-10-06 ENCOUNTER — Other Ambulatory Visit (HOSPITAL_COMMUNITY)
Admission: RE | Admit: 2018-10-06 | Discharge: 2018-10-06 | Disposition: A | Discharge status: Home / Self Care | Payer: Medicaid Other | Source: Ambulatory Visit | Attending: Obstetrics & Gynecology | Admitting: Obstetrics & Gynecology

## 2018-10-06 VITALS — BP 120/80 | HR 77 | Ht 65.0 in | Wt 234.3 lb

## 2018-10-06 DIAGNOSIS — Z01419 Encounter for gynecological examination (general) (routine) without abnormal findings: Secondary | ICD-10-CM

## 2018-10-06 DIAGNOSIS — Z0000 Encounter for general adult medical examination without abnormal findings: Secondary | ICD-10-CM

## 2018-10-06 DIAGNOSIS — Z1239 Encounter for other screening for malignant neoplasm of breast: Secondary | ICD-10-CM

## 2018-10-06 NOTE — Progress Notes (Signed)
Subjective:     Jordan Mcbride is a 48 y.o. female here for a routine exam. LMP 09/04/2018 Pt has monthly cycles.  [id number]. Pt is sexually active  Current complaints: pt is sexually active with no conttraception. Pt has a high prolactin due to her medications.    Gynecologic History Patient's last menstrual period was 09/04/2018. Contraception: none Last Pap: 07/14/2018. Results were: normal Last mammogram: last year. Results were: normal  Obstetric History OB History  Gravida Para Term Preterm AB Living  5 3 3   2 3   SAB TAB Ectopic Multiple Live Births  2       3    # Outcome Date GA Lbr Len/2nd Weight Sex Delivery Anes PTL Lv  5 SAB 07/14/14          4 SAB 10/2011             Birth Comments: System Generated. Please review and update pregnancy details.  3 Term 2008        LIV  2 Term 2006     Vag-Spont   LIV  1 Term 2003     Vag-Spont   LIV     The following portions of the patient's history were reviewed and updated as appropriate: allergies, current medications, past family history, past medical history, past social history, past surgical history and problem list.  Review of Systems Pertinent items are noted in HPI.    Objective:  BP 120/80   Pulse 77   Ht 5\' 5"  (1.651 m)   Wt 234 lb 4.8 oz (106.3 kg)   LMP 09/04/2018   BMI 38.99 kg/m      General Appearance:    Alert, cooperative, no distress, appears stated age  Head:    Normocephalic, without obvious abnormality, atraumatic  Eyes:    conjunctiva/corneas clear, EOM's intact, both eyes  Ears:    Normal external ear canals, both ears  Nose:   Nares normal, septum midline, mucosa normal, no drainage    or sinus tenderness  Throat:   Lips, mucosa, and tongue normal; teeth and gums normal  Neck:   Supple, symmetrical, trachea midline, no adenopathy;    thyroid:  no enlargement/tenderness/nodules  Back:     Symmetric, no curvature, ROM normal, no CVA tenderness  Lungs:     Clear to auscultation bilaterally,  respirations unlabored  Chest Wall:    No tenderness or deformity   Heart:    Regular rate and rhythm, S1 and S2 normal, no murmur, rub   or gallop  Breast Exam:    No tenderness, masses, or nipple abnormality  Abdomen:     Soft, non-tender, bowel sounds active all four quadrants,    no masses, no organomegaly; well healed infraumbilical incision  Genitalia:    Normal female without lesion, discharge or tenderness; uterus: small mobile     Extremities:   Extremities normal, atraumatic, no cyanosis or edema  Pulses:   2+ and symmetric all extremities  Skin:   Skin color, texture, turgor normal, no rashes or lesions    Assessment:    Healthy female exam.   Reg cycles. On Dostinex which is probably limiting conception. Pt does not want contraception at this time     Plan:    Mammogram ordered. Follow up in: 1 year.     [name], M.D., [name]

## 2018-10-06 NOTE — Progress Notes (Signed)
Patient is in the office for annual. Last pap 07-14-17 LMP 09-04-18  Pt reports last mammogram over a year ago.  Pt wants to discuss getting pregnant. Pt reports feeling right sided pelvic pain at times. Pt desires std testing.

## 2018-10-07 LAB — CERVICOVAGINAL ANCILLARY ONLY
BACTERIAL VAGINITIS: NEGATIVE
CHLAMYDIA, DNA PROBE: NEGATIVE
Candida vaginitis: NEGATIVE
NEISSERIA GONORRHEA: NEGATIVE
Trichomonas: NEGATIVE

## 2018-10-07 LAB — HEPATITIS C ANTIBODY: Hep C Virus Ab: <0.1 s/co ratio (ref 0.0–0.9)

## 2018-10-07 LAB — RPR: RPR Ser Ql: NONREACTIVE

## 2018-10-07 LAB — HEPATITIS B SURFACE ANTIGEN: HEP B S AG: NEGATIVE

## 2018-10-07 LAB — HIV ANTIBODY (ROUTINE TESTING W REFLEX): HIV Screen 4th Generation wRfx: NONREACTIVE

## 2018-11-04 ENCOUNTER — Ambulatory Visit: Payer: Medicaid Other | Admitting: Obstetrics and Gynecology

## 2018-11-09 ENCOUNTER — Encounter: Payer: Self-pay | Admitting: Obstetrics & Gynecology

## 2018-11-09 ENCOUNTER — Ambulatory Visit: Payer: Medicaid Other | Admitting: Obstetrics & Gynecology

## 2018-11-09 VITALS — BP 147/97 | HR 67 | Wt 241.5 lb

## 2018-11-09 DIAGNOSIS — Z3202 Encounter for pregnancy test, result negative: Secondary | ICD-10-CM | POA: Diagnosis not present

## 2018-11-09 DIAGNOSIS — N912 Amenorrhea, unspecified: Secondary | ICD-10-CM | POA: Diagnosis not present

## 2018-11-09 LAB — POCT URINE PREGNANCY: Preg Test, Ur: NEGATIVE

## 2018-11-09 NOTE — Progress Notes (Signed)
Pt is here for amenorrhea. LMP 09/02/18.

## 2018-11-09 NOTE — Progress Notes (Signed)
Patient ID: Jordan Mcbride, female   DOB: 08-Aug-1971, 48 y.o.   MRN: [id number]  Chief Complaint  Patient presents with  . Amenorrhea  abdominal pain  HPI Jordan Mcbride is a 48 y.o. female.  [id number] Patient's last menstrual period was 09/02/2018 (exact date). She returns today since she noted increased periumbilical pain since she missed a period in January. No VMS, nausea, constipation. She would like to get pregnant HPI  Past Medical History:  Diagnosis Date  . 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 Ovarian cyst   . Umbilical hernia 9/50/9326    Past Surgical History:  Procedure Laterality Date  . EYE SURGERY     removed eyelid cyst  . INSERTION OF MESH N/A 02/18/2017   Procedure: INSERTION OF MESH;  Surgeon: Jordan Skates, MD;  Location: [facility];  Service: General;  Laterality: N/A;  . UMBILICAL HERNIA REPAIR N/A 02/18/2017   Procedure: REPAIR UMBILICAL HERNIA WITH MESH;  Surgeon: Jordan Skates, MD;  Location: [facility];  Service: General;  Laterality: N/A;    History reviewed. No pertinent family history.  Social History Social History   Tobacco Use  . Smoking status: Never Smoker  . Smokeless tobacco: Never Used  Substance Use Topics  . Alcohol use: Not Currently    Comment: socially  . Drug use: No    No Known Allergies  Current Outpatient Medications  Medication Sig Dispense Refill  . cabergoline (DOSTINEX) 0.5 MG tablet Take 0.25 mg once a week by mouth 15 tablet 3  . cetirizine (ZYRTEC) 10 MG tablet Take 10 mg by mouth daily.    . diclofenac (VOLTAREN) 75 MG EC tablet Take 75 mg by mouth 2 (two) times daily.  1  . ondansetron (ZOFRAN ODT) 4 MG disintegrating tablet Take 1 tablet (4 mg total) by mouth every 8 (eight) hours as needed for nausea or vomiting. (Patient not taking: Reported on 10/06/2018) 20 tablet 0   No current facility-administered medications for this visit.     Review of Systems Review of  Systems  Constitutional: Negative.   Gastrointestinal: Positive for abdominal pain. Negative for anal bleeding, constipation, diarrhea and nausea.  Genitourinary: Positive for menstrual problem and vaginal discharge (mucus). Negative for pelvic pain.  Musculoskeletal: Negative.     Blood pressure (!) 147/97, pulse 67, weight 241 lb 8 oz (109.5 kg), last menstrual period 09/02/2018.  Physical Exam Physical Exam Constitutional:      Appearance: Normal appearance.  Abdominal:     General: There is no distension.     Palpations: There is no mass.     Tenderness: There is no abdominal tenderness.     Hernia: No hernia is present.  Neurological:     Mental Status: She is alert.  Psychiatric:        Mood and Affect: Mood normal.     Data Reviewed CT result 07/20/18 reviewed with her  Assessment    Periumbilical pain and postoperative scarring seen on CT Irregular menses likely perimenopausal    Plan    [facility] Report further menstrual problems F/U with PCP re: elevated BP.If pain increases she may need general surgery referral       Jordan Mcbride 11/09/2018, 2:34 PM

## 2018-11-09 NOTE — Patient Instructions (Signed)

## 2018-11-10 LAB — FOLLICLE STIMULATING HORMONE: FSH: 3.5 m[IU]/mL

## 2018-11-15 ENCOUNTER — Ambulatory Visit
Admission: RE | Admit: 2018-11-15 | Discharge: 2018-11-15 | Disposition: A | Discharge status: Home / Self Care | Payer: Medicaid Other | Source: Ambulatory Visit | Attending: Obstetrics & Gynecology | Admitting: Obstetrics & Gynecology

## 2018-11-15 DIAGNOSIS — Z1239 Encounter for other screening for malignant neoplasm of breast: Secondary | ICD-10-CM

## 2019-04-14 ENCOUNTER — Other Ambulatory Visit: Payer: Self-pay

## 2019-04-18 ENCOUNTER — Ambulatory Visit: Payer: Medicaid Other | Admitting: Internal Medicine

## 2019-07-14 ENCOUNTER — Telehealth: Payer: Self-pay

## 2019-07-14 DIAGNOSIS — E221 Hyperprolactinemia: Secondary | ICD-10-CM

## 2019-07-14 NOTE — Telephone Encounter (Signed)
Patient needing refill on cabergoline (DOSTINEX) 0.5 MG tablet  Please advise

## 2019-07-15 MED ORDER — CABERGOLINE 0.5 MG PO TABS: ORAL_TABLET | ORAL | 0 refills | Status: DC

## 2019-07-15 NOTE — Telephone Encounter (Signed)
RX sent

## 2019-07-18 ENCOUNTER — Ambulatory Visit: Payer: Medicaid Other | Admitting: Internal Medicine

## 2019-08-05 ENCOUNTER — Emergency Department (HOSPITAL_COMMUNITY)
Admission: EM | Admit: 2019-08-05 | Discharge: 2019-08-06 | Disposition: A | Discharge status: Home / Self Care | Payer: Medicaid Other | Attending: Emergency Medicine | Admitting: Emergency Medicine

## 2019-08-05 ENCOUNTER — Emergency Department (HOSPITAL_COMMUNITY): Payer: Medicaid Other

## 2019-08-05 ENCOUNTER — Other Ambulatory Visit: Payer: Self-pay

## 2019-08-05 DIAGNOSIS — W19XXXA Unspecified fall, initial encounter: Secondary | ICD-10-CM

## 2019-08-05 DIAGNOSIS — S6992XA Unspecified injury of left wrist, hand and finger(s), initial encounter: Secondary | ICD-10-CM | POA: Diagnosis present

## 2019-08-05 DIAGNOSIS — Y929 Unspecified place or not applicable: Secondary | ICD-10-CM | POA: Insufficient documentation

## 2019-08-05 DIAGNOSIS — W109XXA Fall (on) (from) unspecified stairs and steps, initial encounter: Secondary | ICD-10-CM | POA: Diagnosis not present

## 2019-08-05 DIAGNOSIS — Z79899 Other long term (current) drug therapy: Secondary | ICD-10-CM | POA: Diagnosis not present

## 2019-08-05 DIAGNOSIS — Y93E9 Activity, other interior property and clothing maintenance: Secondary | ICD-10-CM | POA: Diagnosis not present

## 2019-08-05 DIAGNOSIS — Y999 Unspecified external cause status: Secondary | ICD-10-CM | POA: Insufficient documentation

## 2019-08-05 DIAGNOSIS — S52572A Other intraarticular fracture of lower end of left radius, initial encounter for closed fracture: Secondary | ICD-10-CM | POA: Insufficient documentation

## 2019-08-05 DIAGNOSIS — M25572 Pain in left ankle and joints of left foot: Secondary | ICD-10-CM

## 2019-08-05 MED ORDER — HYDROCODONE-ACETAMINOPHEN 5-325 MG PO TABS
1.0000 | ORAL_TABLET | Freq: Once | ORAL | Status: AC
  Administered 2019-08-05: 1 via ORAL
  Filled 2019-08-05: qty 1

## 2019-08-05 NOTE — ED Provider Notes (Signed)
[facility] EMERGENCY DEPARTMENT Provider Note   CSN: [id number] Arrival date & time: 08/05/19  1904     History   Chief Complaint Chief Complaint  Patient presents with  . Fall    HPI Jordan Mcbride is a 48 y.o. female with history of pituitary adenoma, obesity, ovarian cyst presents today for evaluation of acute onset, persistent and progressively worsening left wrist and left ankle pain secondary to mechanical fall just prior to arrival.  She reports that she was transporting a cabinet down some steps when she missed stepped, landing forward on her outstretched left hand which hyperflexed.  She denies head injury or loss of consciousness.  She reports severe pain to the left wrist which radiates into the fingers.  Worsens with any movement.  Denies any numbness or weakness of the extremities, headaches, nausea, vomiting, neck pain, low back pain.  He also reports very mild pain to the medial aspect of the left ankle which she only experiences when she bears weight.  She is not on any blood thinners.  She is right-hand dominant.     The history is provided by the patient.    Past Medical History:  Diagnosis Date  . 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 Ovarian cyst   . Umbilical hernia [id number]    Patient Active Problem List   Diagnosis Date Noted  . 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 Hyperprolactinemia ([place]) 07/14/2017  . Morbid obesity ([place]) 07/14/2017  . Umbilical hernia [id number]  . Pelvic pain in female 11/08/2012  . Internal hemorrhoids 02/19/2012    Past Surgical History:  Procedure Laterality Date  . EYE SURGERY     removed eyelid cyst  . INSERTION OF MESH N/A 02/18/2017   Procedure: INSERTION OF MESH;  Surgeon: Fanny Skates, MD;  Location: [facility];  Service: General;  Laterality: N/A;  . UMBILICAL HERNIA REPAIR N/A 02/18/2017   Procedure: REPAIR UMBILICAL HERNIA WITH MESH;  Surgeon: Fanny Skates, MD;   Location: [facility];  Service: General;  Laterality: N/A;     OB History    Gravida  5   Para  3   Term  3   Preterm      AB  2   Living  3     SAB  2   TAB      Ectopic      Multiple      Live Births  3            Home Medications    Prior to Admission medications   Medication Sig Start Date End Date Taking? Authorizing Provider  cabergoline (DOSTINEX) 0.5 MG tablet Take 0.25 mg once a week by mouth. Further refills require appointment 07/15/19   Philemon Kingdom, MD  cetirizine (ZYRTEC) 10 MG tablet Take 10 mg by mouth daily.    [provider]  diclofenac (VOLTAREN) 75 MG EC tablet Take 75 mg by mouth 2 (two) times daily. 07/09/18   [provider]  HYDROcodone-acetaminophen (NORCO/VICODIN) 5-325 MG tablet Take 1 tablet by mouth every 6 (six) hours as needed for severe pain. 08/06/19   Higinio Grow A, PA-C  ibuprofen (ADVIL) 600 MG tablet Take 1 tablet (600 mg total) by mouth every 6 (six) hours as needed. 08/06/19   Kristi Norment A, PA-C  ondansetron (ZOFRAN ODT) 4 MG disintegrating tablet Take 1 tablet (4 mg total) by mouth every 8 (eight) hours as needed for nausea or  vomiting. Patient not taking: Reported on 10/06/2018 07/20/18   Sidney Ace    Family History No family history on file.  Social History Social History   Tobacco Use  . Smoking status: Never Smoker  . Smokeless tobacco: Never Used  Substance Use Topics  . Alcohol use: Not Currently    Comment: socially  . Drug use: No     Allergies   Patient has no known allergies.   Review of Systems Review of Systems  Constitutional: Negative for chills and fever.  Respiratory: Negative for shortness of breath.   Cardiovascular: Negative for chest pain.  Gastrointestinal: Negative for abdominal pain.  Musculoskeletal: Positive for arthralgias and joint swelling. Negative for back pain and neck pain.  Neurological: Negative for weakness, numbness and  headaches.  All other systems reviewed and are negative.    Physical Exam Updated Vital Signs BP (!) 154/88   Pulse 71   Temp 98.1 F (36.7 C) (Oral)   Resp 20   SpO2 100%   Physical Exam Vitals signs and nursing note reviewed.  Constitutional:      General: She is not in acute distress.    Appearance: She is well-developed.  HENT:     Head: Normocephalic and atraumatic.     Comments: No Battle's signs, no raccoon's eyes, no rhinorrhea.  No deformity, crepitus, or swelling noted.  Eyes:     General:        Right eye: No discharge.        Left eye: No discharge.     Extraocular Movements: Extraocular movements intact.     Conjunctiva/sclera: Conjunctivae normal.     Pupils: Pupils are equal, round, and reactive to light.  Neck:     Musculoskeletal: Normal range of motion and neck supple.     Vascular: No JVD.     Trachea: No tracheal deviation.     Comments: No midline spine TTP, no paraspinal muscle tenderness, no deformity, crepitus, or step-off noted  Cardiovascular:     Rate and Rhythm: Normal rate.     Pulses: Normal pulses.     Comments: 2+ radial and DP/PT pulses bilaterally,no lower extremity edema, no palpable cords, compartments are soft  Pulmonary:     Effort: Pulmonary effort is normal.  Abdominal:     General: There is no distension.  Musculoskeletal:        General: Swelling, tenderness, deformity and signs of injury present.     Comments: Swelling and deformity noted to the left wrist.  Examination of the left wrist is limited due to pain she has difficulty with flexion and extension but is able to flex and extend wrist and digits against resistance with encouragement.  Good grip strength bilaterally.  No midline cervical, lumbar, thoracic spine tenderness.  No deformity, crepitus, or step-off.  5/5 strength of BUE and BLE major muscle groups.  No tenderness to palpation of the left ankle at rest, no ligamentous laxity or varus or valgus instability.   Skin:    General: Skin is warm and dry.     Capillary Refill: Capillary refill takes less than 2 seconds.     Findings: No erythema.  Neurological:     General: No focal deficit present.     Mental Status: She is alert and oriented to person, place, and time.     Cranial Nerves: No cranial nerve deficit.     Sensory: No sensory deficit.     Motor: No weakness.  Psychiatric:  Behavior: Behavior normal.      ED Treatments / Results  Labs (all labs ordered are listed, but only abnormal results are displayed) Labs Reviewed - No data to display  EKG None  Radiology Dg Wrist Complete Left  Result Date: 08/05/2019 CLINICAL DATA:  Fall, pain EXAM: LEFT WRIST - COMPLETE 3+ VIEW COMPARISON:  None. FINDINGS: There are comminuted, intra-articular fractures of the distal left radius without significant displacement or angulation. The distal left ulna is intact. The carpus is normally aligned. Soft tissues are unremarkable. IMPRESSION: There are comminuted, intra-articular fractures of the distal left radius without significant displacement or angulation. The distal left ulna is intact. The carpus is normally aligned. Electronically Signed   By: Eddie Candle M.D.   On: 08/05/2019 20:18   Dg Knee Complete 4 Views Left  Result Date: 08/05/2019 CLINICAL DATA:  Fall, pain EXAM: LEFT KNEE - COMPLETE 4+ VIEW COMPARISON:  None. FINDINGS: No fracture or dislocation of the left knee. Joint spaces are preserved. No knee joint effusion. Soft tissues are unremarkable. IMPRESSION: No fracture or dislocation of the left knee. Electronically Signed   By: Eddie Candle M.D.   On: 08/05/2019 20:19    Procedures .Splint Application  Date/Time: 08/06/2019 12:25 AM Performed by: Renita Papa, PA-C Authorized by: Renita Papa, PA-C   Consent:    Consent obtained:  Verbal   Consent given by:  Patient   Risks discussed:  Discoloration, numbness, pain and swelling   Alternatives discussed:  No  treatment Pre-procedure details:    Sensation:  Normal Procedure details:    Laterality:  Left   Location:  Wrist   Wrist:  L wrist   Splint type:  Sugar tong   Supplies:  Ortho-Glass Post-procedure details:    Pain:  Improved   Sensation:  Normal   Patient tolerance of procedure:  Tolerated well, no immediate complications   (including critical care time)  Medications Ordered in ED Medications  HYDROcodone-acetaminophen (NORCO/VICODIN) 5-325 MG per tablet 1 tablet (1 tablet Oral Given 08/05/19 2324)     Initial Impression / Assessment and Plan / ED Course  I have reviewed the triage vital signs and the nursing notes.  Pertinent labs & imaging results that were available during my care of the patient were reviewed by me and considered in my medical decision making (see chart for details).        Patient with left wrist pain and mild left medial malleolus pain with ambulation only secondary to mechanical fall down 1 step.  She is afebrile, persistently hypertensive in the ED but this is likely secondary to pain.  She is neurovascularly intact.  No signs of serious head injury.  No midline spine tenderness.  No complaint of chest pain or abdominal pain.  Radiographs show comminuted intra-articular fractures of the left distal radius without significant displacement or angulation.  Left ulna is intact.  Compartments are soft.  CONSULT: Spoke with Dr. Fredna Dow with hand surgery, recommends sugar tong splint and outpatient follow-up.  Patient's pain has been managed in the ED, she is neurovascularly intact both before and after splint placement.  Discussed pain management with NSAIDs, Tylenol, hydrocodone at home, advised of appropriate use of medications.  Discussed strict ED return precautions.  She understands to call Dr. Levell July office on Monday to set up follow-up appointment. Patient verbalized understanding of and agreement with plan and is safe for discharge home at this time.     Final Clinical Impressions(s) / ED  Diagnoses   Final diagnoses:  Other closed intra-articular fracture of distal end of left radius, initial encounter  Acute left ankle pain  Fall, initial encounter    ED Discharge Orders         Ordered    HYDROcodone-acetaminophen (NORCO/VICODIN) 5-325 MG tablet  Every 6 hours PRN     08/06/19 0015    ibuprofen (ADVIL) 600 MG tablet  Every 6 hours PRN     08/06/19 0015           Renita Papa, PA-C 08/06/19 0026    Lucrezia Starch, MD 08/09/19 1149

## 2019-08-05 NOTE — ED Triage Notes (Signed)
Patient states that she tripped and fell down some steps. Swelling to left wrist noted. Denies LOC. Also c/o left knee pain.

## 2019-08-06 MED ORDER — IBUPROFEN 600 MG PO TABS: 600.0000 mg | ORAL_TABLET | Freq: Four times a day (QID) | ORAL | 0 refills | Status: DC | PRN

## 2019-08-06 MED ORDER — HYDROCODONE-ACETAMINOPHEN 5-325 MG PO TABS: 1.0000 | ORAL_TABLET | Freq: Four times a day (QID) | ORAL | 0 refills | Status: DC | PRN

## 2019-08-06 NOTE — Progress Notes (Signed)
Orthopedic Tech Progress Note Patient Details:  MICHEAL MATHWIG 31-May-1971 [id number]  Ortho Devices Type of Ortho Device: Arm sling, Sugartong splint Ortho Device/Splint Location: lue Ortho Device/Splint Interventions: Ordered, Application, Adjustment   Post Interventions Patient Tolerated: Well Instructions Provided: Care of device, Adjustment of device   Karolee Stamps 08/06/2019, 12:37 AM

## 2019-08-06 NOTE — Discharge Instructions (Addendum)
1. Medications: Alternate 600 mg of ibuprofen and [phone] mg of Tylenol every 3-6 hours as needed for pain. Do not exceed 4000 mg of Tylenol daily.  Take ibuprofen with food to avoid upset stomach issues.  Take hydrocodone as needed for severe pain but do not drive, drink alcohol, or operate heavy machinery while taking this medication as it can make you drowsy.  It can also cause constipation so take with a stool softener if it causes this.  Be aware this medication also contains Tylenol and again do not exceed more than 4000 mg of Tylenol daily. 2. Treatment: rest, ice, elevate; wear the splint at all times.  Keep it clean and dry.  Cover and tape a bag over it to avoid getting it wet when you shower. 3. Follow Up: Please followup with orthopedics as directed within 1-2 weeks, call on Monday to set up follow-up appointment for discussion of your diagnoses and further evaluation after today's visit; Please return to the ER for worsening symptoms or other concerns such as worsening swelling, redness of the skin, fevers, loss of pulses, or loss of feeling

## 2019-08-23 ENCOUNTER — Ambulatory Visit (INDEPENDENT_AMBULATORY_CARE_PROVIDER_SITE_OTHER): Payer: Medicaid Other | Admitting: Internal Medicine

## 2019-08-23 ENCOUNTER — Encounter: Payer: Self-pay | Admitting: Internal Medicine

## 2019-08-23 VITALS — BP 130/78 | HR 77 | Ht 65.0 in | Wt 241.0 lb

## 2019-08-23 DIAGNOSIS — E221 Hyperprolactinemia: Secondary | ICD-10-CM | POA: Diagnosis not present

## 2019-08-23 DIAGNOSIS — D352 Benign neoplasm of pituitary gland: Secondary | ICD-10-CM

## 2019-08-23 MED ORDER — CABERGOLINE 0.5 MG PO TABS: ORAL_TABLET | ORAL | 0 refills | Status: DC

## 2019-08-23 NOTE — Patient Instructions (Addendum)
Please restart Cabergoline 0.25 mg once a week.  Please stop at the lab.  Please come back for labs in 2 months.  Please come back for a follow-up appointment in 1 year.

## 2019-08-23 NOTE — Progress Notes (Signed)
Patient ID: Jordan Mcbride, female   DOB: 01-21-1971, 48 y.o.   MRN: [id number]   This visit occurred during the SARS-CoV-2 public health emergency.  Safety protocols were in place, including screening questions prior to the visit, additional usage of staff PPE, and extensive cleaning of exam room while observing appropriate contact time as indicated for disinfecting solutions.   HPI  Jordan Mcbride is a 48 y.o.-year-old female, returning for follow-up for pituitary adenoma and hyperprolactinemia.  Last visit 1 year and 4 months ago.  She fell and fractured her radius on 08/05/2019.  Her left arm is now in a cast.   At this visit, she tells me that she has been off cabergoline for at least 5 months as she could not refill it and she missed her appointments with the lab and with me.  Reviewed and addended history: Pt. has been dx with hyperprolactinemia in 2001 during investigation for infertility. She was found at that time to have a pituitary adenoma. She was started on Bromocriptine since then until 2017 when she changed to cabergoline: 1 tab 2x a day (!) >> started to experience numbness in face, eyelid skin changes>> was on it for 1 year >> then back on Bromocriptine 2.5 mg a day >> ran out 10/2016.  We started cabergoline 0.25 mg every other day initially, 6 months ago.  We rechecked her prolactin level in 11/2017 and her prolactin decreased significantly so we decreased her cabergoline dose to 0.25 mg twice a week.  At last visit we were able to decrease it further to 0.25 mg once a week.  However, she did not return for repeat prolactin afterwards.  Reviewed her imaging tests: Pituitary MRI (06/17/2012): Stable central pituitary adenoma of 8 x 9 x 11 mm (previously 8 x 10 x 10 mm) pituitary gland appears to be displaced upwards,  with effacement of the optic chiasm but without significant mass-effect.  Pituitary MRI (10/18/2017): Hypoenhancing pituitary adenoma shows interval growth, now  measuring 10 x 12 x 12 mm. Mild flattening of the undersurface of the optic chiasm without compression of the chiasm.  Reviewed her prolactin levels: Lab Results  Component Value Date   PROLACTIN 9.0 04/12/2018   PROLACTIN 2.3 12/21/2017   PROLACTIN 158.2 (H) 10/14/2017  06/08/2017: Prolactin 191.5  Other pertinent labs: 06/08/2017: LH 2.6, FSH 11.8, TSH 1.49  In 2018-2019, she was sent me that she was interested in getting pregnant again.  However, as of now, she tells me that she is not trying to get pregnant anymore.  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 Pt. also has a history of umbilical hernia - had Sx 2018.  ROS: Constitutional: no weight gain/no weight loss, no fatigue, no subjective hyperthermia, no subjective hypothermia Eyes: no blurry vision, no xerophthalmia ENT: no sore throat, no nodules palpated in neck, no dysphagia, no odynophagia, no hoarseness Cardiovascular: no CP/no SOB/no palpitations/no leg swelling Respiratory: no cough/no SOB/no wheezing Gastrointestinal: no N/no V/no D/no C/no acid reflux Musculoskeletal: no muscle aches/+ joint aches - knees Skin: no rashes, no hair loss Neurological: no tremors/no numbness/no tingling/no dizziness  I reviewed pt's medications, allergies, PMH, social hx, family hx, and changes were documented in the history of present illness. Otherwise, unchanged from my initial visit note.  Past Medical History:  Diagnosis Date  . Brain tumor ([place])    micro-pituitary adenoma  . Ovarian  cyst   . Umbilical hernia 123XX123   Past Surgical History:  Procedure Laterality Date  . EYE SURGERY     removed eyelid cyst  . INSERTION OF MESH N/A 02/18/2017   Procedure: INSERTION OF MESH;  Surgeon: Fanny Skates, MD;  Location: [place];  Service: General;  Laterality:  N/A;  . UMBILICAL HERNIA REPAIR N/A 02/18/2017   Procedure: REPAIR UMBILICAL HERNIA WITH MESH;  Surgeon: Fanny Skates, MD;  Location: [place];  Service: General;  Laterality: N/A;   Social History   Socioeconomic History  . Marital status: Single    Spouse name: Not on file  . Number of children: 4        Occupational History  . braider  Tobacco Use  . Smoking status: Never Smoker  . Smokeless tobacco: Never Used  Substance and Sexual Activity  . Alcohol use: Yes    Comment: socially  . Drug use: No   Current Outpatient Medications on File Prior to Visit  Medication Sig Dispense Refill  . cabergoline (DOSTINEX) 0.5 MG tablet Take 0.25 mg once a week by mouth. Further refills require appointment 15 tablet 0  . cetirizine (ZYRTEC) 10 MG tablet Take 10 mg by mouth daily.    . diclofenac (VOLTAREN) 75 MG EC tablet Take 75 mg by mouth 2 (two) times daily.  1  . HYDROcodone-acetaminophen (NORCO/VICODIN) 5-325 MG tablet Take 1 tablet by mouth every 6 (six) hours as needed for severe pain. 12 tablet 0  . ibuprofen (ADVIL) 600 MG tablet Take 1 tablet (600 mg total) by mouth every 6 (six) hours as needed. 30 tablet 0  . ondansetron (ZOFRAN ODT) 4 MG disintegrating tablet Take 1 tablet (4 mg total) by mouth every 8 (eight) hours as needed for nausea or vomiting. (Patient not taking: Reported on 10/06/2018) 20 tablet 0   No current facility-administered medications on file prior to visit.    No Known Allergies   No family history on file.  PE: BP 130/78   Pulse 77   Ht 5\' 5"  (1.651 m)   Wt 241 lb (109.3 kg)   SpO2 99%   BMI 40.10 kg/m  Wt Readings from Last 3 Encounters:  08/23/19 241 lb (109.3 kg)  11/09/18 241 lb 8 oz (109.5 kg)  10/06/18 234 lb 4.8 oz (106.3 kg)   Constitutional: overweight, in NAD Eyes: PERRLA, EOMI, no exophthalmos ENT: moist mucous membranes, no thyromegaly, no cervical lymphadenopathy Cardiovascular: RRR, No MRG Respiratory: CTA  B Gastrointestinal: abdomen soft, NT, ND, BS+ Musculoskeletal: no deformities, strength intact in all 4 Skin: moist, warm, no rashes Neurological: no tremor with outstretched hands, DTR normal in all 4  ASSESSMENT: 1. Hyperprolactinemia  2. Pituitary adenoma  PLAN:  1. Patient with history of high prolactin level and a history of microadenoma since 2001, increased at last check in 09/2017.  She has been on Procrit in the past for many years and then tried cabergoline but at the very high dose of 1 tablet twice a day, which she could not tolerate due to dizziness.  She did try to take it for approximately a year but then she switched back to bromocriptine) out of this in 10/2016.  2 months later, she stopped having menstrual cycles and she slowly started to have galactorrhea.  In 09/2017, her prolactin was high, at 158.2 and we started cabergoline 0.25 mg every other day.  Her prolactin level was subsequently excellent, 2.3, so we were able to decrease  the dose of cabergoline to 0.25 mg twice a week.  At last visit, we decreased it further, to 0.25 mg once a week.  However, she did not return for another prolactin check 2 months after the dose change.  At this visit, she is telling me that she has been about of Cabergoline for the last approximately 5 months and she could not refill it... She does not complain of galactorrhea or increased headaches. -We would restart cabergoline now. -We will recheck her prolactin level today and decide about thestarting dose of after the results return but most likely we can start at 0.25 mg once a week.  We will then recheck her prolactin level in 2 months. -Otherwise, I will see her back in a year.   2.  Pituitary adenoma -Please also see problem #1 -Reviewed the report of her latest MRI from 09/2017: Her pituitary adenoma appeared larger -We started cabergoline after the results of the MRI of 8/10, initially at 0.25 mg every other day, but as high prolactin  level improved, we were able to decrease the dose to twice a week and, at last visit, to only once a week.  She is now off Coumadin completely for the last 5 months -We may need to repeat an MRI but only after staying with normal prolactin levels for at least a year.  Office Visit on 08/23/2019  Component Date Value Ref Range Status  . Prolactin 08/23/2019 6.1  ng/mL Final   Comment:             Reference Range  Females         Non-pregnant        3.0-30.0         Pregnant           10.0-209.0         Postmenopausal      2.0-20.0 . . .   Surprisingly, prolactin level is normal.  I will have her back in 2 months and check a neat and diluted PRL level.  We will hold off starting Cabergoline for now.  If prolactin remains normal at that time, I would like to repeat a pituitary MRI.  Message sent: Dear Ms. Biffle, I am very surprised by the results of your prolactin, which is now even better than before, also you have been off Cabergoline for several months.  I would like to repeat the level in 2 months, but it appears that you do not need Cabergoline for now.  Please hold onto the prescription as we may need it in the future. Sincerely, Philemon Kingdom MD  Philemon Kingdom, MD PhD [facility]

## 2019-08-24 LAB — PROLACTIN: Prolactin: 6.1 ng/mL

## 2019-10-25 ENCOUNTER — Other Ambulatory Visit: Payer: Medicaid Other

## 2019-10-28 ENCOUNTER — Other Ambulatory Visit: Payer: Medicaid Other

## 2019-11-08 ENCOUNTER — Other Ambulatory Visit: Payer: Self-pay

## 2019-11-08 ENCOUNTER — Other Ambulatory Visit (INDEPENDENT_AMBULATORY_CARE_PROVIDER_SITE_OTHER): Payer: Medicaid Other

## 2019-11-08 DIAGNOSIS — E221 Hyperprolactinemia: Secondary | ICD-10-CM | POA: Diagnosis not present

## 2019-11-08 DIAGNOSIS — D352 Benign neoplasm of pituitary gland: Secondary | ICD-10-CM

## 2019-11-09 LAB — PROLACTIN W/DILUTION: PROLACTIN,UNDILUTED: 8.8 ng/mL (ref 2.0–30.0)

## 2019-11-10 ENCOUNTER — Other Ambulatory Visit: Payer: Self-pay | Admitting: Internal Medicine

## 2019-11-10 DIAGNOSIS — D352 Benign neoplasm of pituitary gland: Secondary | ICD-10-CM

## 2019-12-08 ENCOUNTER — Other Ambulatory Visit: Payer: Self-pay | Admitting: Internal Medicine

## 2019-12-13 ENCOUNTER — Other Ambulatory Visit: Payer: Medicaid Other

## 2019-12-15 ENCOUNTER — Other Ambulatory Visit: Payer: Self-pay

## 2019-12-15 ENCOUNTER — Other Ambulatory Visit (HOSPITAL_COMMUNITY)
Admission: RE | Admit: 2019-12-15 | Discharge: 2019-12-15 | Disposition: A | Discharge status: Home / Self Care | Payer: Medicaid Other | Source: Ambulatory Visit | Attending: Obstetrics & Gynecology | Admitting: Obstetrics & Gynecology

## 2019-12-15 ENCOUNTER — Encounter: Payer: Self-pay | Admitting: Obstetrics & Gynecology

## 2019-12-15 ENCOUNTER — Ambulatory Visit (INDEPENDENT_AMBULATORY_CARE_PROVIDER_SITE_OTHER): Payer: Medicaid Other | Admitting: Obstetrics & Gynecology

## 2019-12-15 VITALS — BP 135/86 | HR 73 | Wt 243.0 lb

## 2019-12-15 DIAGNOSIS — Z1151 Encounter for screening for human papillomavirus (HPV): Secondary | ICD-10-CM

## 2019-12-15 DIAGNOSIS — Z1231 Encounter for screening mammogram for malignant neoplasm of breast: Secondary | ICD-10-CM | POA: Diagnosis not present

## 2019-12-15 DIAGNOSIS — Z01419 Encounter for gynecological examination (general) (routine) without abnormal findings: Secondary | ICD-10-CM | POA: Diagnosis present

## 2019-12-15 NOTE — Patient Instructions (Signed)

## 2019-12-15 NOTE — Progress Notes (Signed)
GYNECOLOGY ANNUAL PREVENTATIVE CARE ENCOUNTER NOTE  History:     Jordan Mcbride is a 49 y.o. [phone] perimenopausal female here for a routine annual gynecologic exam.  Current complaints: none.   Denies abnormal vaginal bleeding, discharge, pelvic pain, problems with intercourse or other gynecologic concerns.    Gynecologic History No LMP recorded. Patient is perimenopausal. Contraception: none Last Pap: 2020. Results were: normal with negative HPV Last mammogram: 2020. Results were: normal  Obstetric History OB History  Gravida Para Term Preterm AB Living  5 3 3   2 3   SAB TAB Ectopic Multiple Live Births  2       3    # Outcome Date GA Lbr Len/2nd Weight Sex Delivery Anes PTL Lv  5 SAB 07/14/14          4 SAB 10/2011             Birth Comments: System Generated. Please review and update pregnancy details.  3 Term 2008        LIV  2 Term 2006     Vag-Spont   LIV  1 Term 2003     Vag-Spont   LIV    Past Medical History:  Diagnosis Date  . Brain tumor ([place])    micro-pituitary adenoma  . Ovarian cyst   . Umbilical hernia 123XX123    Past Surgical History:  Procedure Laterality Date  . EYE SURGERY     removed eyelid cyst  . INSERTION OF MESH N/A 02/18/2017   Procedure: INSERTION OF MESH;  Surgeon: Fanny Skates, MD;  Location: [place];  Service: General;  Laterality: N/A;  . UMBILICAL HERNIA REPAIR N/A 02/18/2017   Procedure: REPAIR UMBILICAL HERNIA WITH MESH;  Surgeon: Fanny Skates, MD;  Location: [place];  Service: General;  Laterality: N/A;    No current outpatient medications on file prior to visit.   No current facility-administered medications on file prior to visit.    No Known Allergies  Social History:  reports that she has never smoked. She has never used smokeless tobacco. She reports previous alcohol use. She reports that she does not use drugs.  History reviewed. No pertinent family history.  The  following portions of the patient's history were reviewed and updated as appropriate: allergies, current medications, past family history, past medical history, past social history, past surgical history and problem list.  Review of Systems Pertinent items noted in HPI and remainder of comprehensive ROS otherwise negative.  Physical Exam:  BP 135/86   Pulse 73   Wt 243 lb (110.2 kg)   BMI 40.44 kg/m  CONSTITUTIONAL: Well-developed, well-nourished female in no acute distress.  HENT:  Normocephalic, atraumatic, External right and left ear normal. Oropharynx is clear and moist EYES: Conjunctivae and EOM are normal. Pupils are equal, round, and reactive to light. No scleral icterus.  NECK: Normal range of motion, supple, no masses.  Normal thyroid.  SKIN: Skin is warm and dry. No rash noted. Not diaphoretic. No erythema. No pallor. MUSCULOSKELETAL: Normal range of motion. No tenderness.  No cyanosis, clubbing, or edema.  2+ distal pulses. NEUROLOGIC: Alert and oriented to person, place, and time. Normal reflexes, muscle tone coordination.  PSYCHIATRIC: Normal mood and affect. Normal behavior. Normal judgment and thought content. CARDIOVASCULAR: Normal heart rate noted, regular rhythm RESPIRATORY: Clear to auscultation bilaterally. Effort and breath sounds normal, no problems with respiration noted. BREASTS: Symmetric in size. No masses, tenderness, skin changes, nipple drainage, or  lymphadenopathy bilaterally. Performed in the presence of a chaperone. ABDOMEN: Soft, obese, no distention noted.  No tenderness, rebound or guarding.  PELVIC: Normal appearing external genitalia and urethral meatus; normal appearing vaginal mucosa with mild atrophy and cervix.  No abnormal discharge noted.  Pap smear obtained.  Unable to fully palpate uterus due to habitus, unable to feel adnexa.  No tenderness during bimanual exam.  Performed in the presence of a chaperone.   Assessment and Plan:      1. Breast  cancer screening by mammogram Mammogram to be scheduled - MM 3D SCREEN BREAST BILATERAL; Future  2. Well woman exam with routine gynecological exam - Cytology - PAP - Cervicovaginal ancillary only - CBC; Future - TSH; Future - Hemoglobin A1c; Future - VITAMIN D 25 Hydroxy (Vit-D Deficiency, Fractures); Future - Lipid panel; Future - Comprehensive metabolic panel; Future - HIV Antibody (routine testing w rflx); Future - Hepatitis C antibody; Future - Hepatitis B surface antigen; Future - RPR; Future Will follow up results of pap smear and other labs and manage accordingly.  Will return on a later date to get the serum labs (not fasting today).  Routine preventative health maintenance measures emphasized. Please refer to After Visit Summary for other counseling recommendations.      Verita Schneiders, MD, [place] for [employer], [place]

## 2019-12-16 LAB — CYTOLOGY - PAP
Comment: NEGATIVE
Diagnosis: NEGATIVE
High risk HPV: NEGATIVE

## 2019-12-16 LAB — CERVICOVAGINAL ANCILLARY ONLY
Bacterial Vaginitis (gardnerella): NEGATIVE
Candida Glabrata: NEGATIVE
Candida Vaginitis: NEGATIVE
Chlamydia: NEGATIVE
Comment: NEGATIVE
Comment: NEGATIVE
Comment: NEGATIVE
Comment: NEGATIVE
Comment: NEGATIVE
Comment: NORMAL
Neisseria Gonorrhea: NEGATIVE
Trichomonas: NEGATIVE

## 2020-01-12 ENCOUNTER — Other Ambulatory Visit: Payer: Self-pay

## 2020-01-12 ENCOUNTER — Ambulatory Visit
Admission: RE | Admit: 2020-01-12 | Discharge: 2020-01-12 | Disposition: A | Discharge status: Home / Self Care | Payer: Medicaid Other | Source: Ambulatory Visit | Attending: Internal Medicine | Admitting: Internal Medicine

## 2020-01-12 DIAGNOSIS — D352 Benign neoplasm of pituitary gland: Secondary | ICD-10-CM

## 2020-01-12 MED ORDER — GADOBENATE DIMEGLUMINE 529 MG/ML IV SOLN
10.0000 mL | Freq: Once | INTRAVENOUS | Status: AC | PRN
  Administered 2020-01-12: 10 mL via INTRAVENOUS

## 2020-02-21 ENCOUNTER — Encounter (HOSPITAL_COMMUNITY): Payer: Self-pay | Admitting: Emergency Medicine

## 2020-02-21 ENCOUNTER — Other Ambulatory Visit: Payer: Self-pay

## 2020-02-21 ENCOUNTER — Emergency Department (HOSPITAL_COMMUNITY)
Admission: EM | Admit: 2020-02-21 | Discharge: 2020-02-22 | Disposition: A | Discharge status: Home / Self Care | Payer: Medicaid Other | Attending: Emergency Medicine | Admitting: Emergency Medicine

## 2020-02-21 DIAGNOSIS — Z5321 Procedure and treatment not carried out due to patient leaving prior to being seen by health care provider: Secondary | ICD-10-CM | POA: Insufficient documentation

## 2020-02-21 DIAGNOSIS — M542 Cervicalgia: Secondary | ICD-10-CM | POA: Insufficient documentation

## 2020-02-21 NOTE — ED Triage Notes (Signed)
Pt c/o neck pain x 3 days. Pain worse with palpation and when pt raises her right arm. Denies injury/trauma.

## 2020-02-22 ENCOUNTER — Encounter (HOSPITAL_COMMUNITY): Payer: Self-pay

## 2020-02-22 ENCOUNTER — Emergency Department (HOSPITAL_COMMUNITY)
Admission: EM | Admit: 2020-02-22 | Discharge: 2020-02-22 | Disposition: A | Discharge status: Home / Self Care | Payer: Medicaid Other | Attending: Emergency Medicine | Admitting: Emergency Medicine

## 2020-02-22 ENCOUNTER — Other Ambulatory Visit: Payer: Self-pay

## 2020-02-22 ENCOUNTER — Emergency Department (HOSPITAL_COMMUNITY): Payer: Medicaid Other

## 2020-02-22 DIAGNOSIS — R05 Cough: Secondary | ICD-10-CM | POA: Diagnosis not present

## 2020-02-22 DIAGNOSIS — M542 Cervicalgia: Secondary | ICD-10-CM | POA: Insufficient documentation

## 2020-02-22 MED ORDER — NAPROXEN 500 MG PO TABS: 500.0000 mg | ORAL_TABLET | Freq: Two times a day (BID) | ORAL | 0 refills | Status: AC

## 2020-02-22 MED ORDER — LIDOCAINE 5 % EX PTCH
1.0000 | MEDICATED_PATCH | Freq: Once | CUTANEOUS | Status: DC
  Administered 2020-02-22: 1 via TRANSDERMAL
  Filled 2020-02-22: qty 1

## 2020-02-22 NOTE — ED Provider Notes (Signed)
[facility] EMERGENCY DEPARTMENT Provider Note   CSN: [id number] Arrival date & time: 02/22/20  1009     History Chief Complaint  Patient presents with  . Neck Pain    Jordan Mcbride is a 49 y.o. female with past medical history significant for micropituitary adenoma, ovarian cyst, umbilical hernia presents to emergency department today with chief complaint of progressively worsening neck pain x3 days.  She states the pain is located on the right side of her neck.  She states the pain is worse with coughing. She has non productive cough x 3 days as well. She describes the pain as a dull aching sensation.  Pain does not radiate.  She rates the pains 8 out of 10 in severity.  She has not taken any medications for symptoms prior to arrival.  She denies any known injury or trauma.  She denies any fevers, chills, chest pain, shortness of breath, productive cough, abdominal pain, nausea, vomiting, urinary symptoms, diarrhea, rash, back pain, headache, neck stiffness. Denies any sick contacts or known covid exposures.    Past Medical History:  Diagnosis Date  . Brain tumor ([place])    micro-pituitary adenoma  . Ovarian cyst   . Umbilical hernia 123XX123    Patient Active Problem List   Diagnosis Date Noted  . 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 Hyperprolactinemia ([place]) 07/14/2017  . Morbid obesity ([place]) 07/14/2017  . Umbilical hernia [id number]  . Internal hemorrhoids 02/19/2012    Past Surgical History:  Procedure Laterality Date  . EYE SURGERY     removed eyelid cyst  . INSERTION OF MESH N/A 02/18/2017   Procedure: INSERTION OF MESH;  Surgeon: Fanny Skates, MD;  Location: [facility];  Service: General;  Laterality: N/A;  . UMBILICAL HERNIA REPAIR N/A 02/18/2017   Procedure: REPAIR UMBILICAL HERNIA WITH MESH;  Surgeon: Fanny Skates, MD;  Location: [place];  Service: General;  Laterality: N/A;     OB History    Gravida  5   Para  3   Term  3   Preterm      AB  2   Living  3     SAB  2   TAB      Ectopic      Multiple      Live Births  3           No family history on file.  Social History   Tobacco Use  . Smoking status: Never Smoker  . Smokeless tobacco: Never Used  Substance Use Topics  . Alcohol use: Not Currently    Comment: socially  . Drug use: No    Home Medications Prior to Admission medications   Medication Sig Start Date End Date Taking? Authorizing Provider  naproxen (NAPROSYN) 500 MG tablet Take 1 tablet (500 mg total) by mouth 2 (two) times daily for 5 days. 02/22/20 02/27/20  Albrizze, Harley Hallmark, PA-C    Allergies    Patient has no known allergies.  Review of Systems   Review of Systems All other systems are reviewed and are negative for acute change except as noted in the HPI.  Physical Exam Updated Vital Signs BP (!) 159/83 (BP Location: Right Arm)   Pulse 69   Temp 97.9 F (36.6 C) (Oral)   Resp 17   SpO2 99%   Physical Exam Vitals and nursing note reviewed.  Constitutional:      Appearance: She is well-developed. She is not ill-appearing  or toxic-appearing.  HENT:     Head: Normocephalic and atraumatic.     Nose: Nose normal.  Eyes:     General: No scleral icterus.       Right eye: No discharge.        Left eye: No discharge.     Conjunctiva/sclera: Conjunctivae normal.  Neck:     Vascular: No JVD.      Comments: Tenderness to palpation as depicted in image above.  No overlying warmth or tenderness.  No overlying skin changes. Cardiovascular:     Rate and Rhythm: Normal rate and regular rhythm.     Pulses: Normal pulses.     Heart sounds: Normal heart sounds.  Pulmonary:     Effort: Pulmonary effort is normal.     Breath sounds: Normal breath sounds.  Chest:     Breasts:        Left: No tenderness.  Abdominal:     General: There is no distension.  Musculoskeletal:        General: Normal range of motion.     Right shoulder: Normal.      Right elbow: Normal.     Right wrist: Normal.     Cervical back: Normal range of motion. No rigidity, torticollis or crepitus.     Comments: Full range of motion of the T-spine and L-spine No tenderness to palpation of the spinous processes of the T-spine or L-spine No crepitus, deformity or step-offs No tenderness to palpation of the paraspinous muscles of the L-spine     Lymphadenopathy:     Cervical: No cervical adenopathy.  Skin:    General: Skin is warm and dry.  Neurological:     Mental Status: She is oriented to person, place, and time.     GCS: GCS eye subscore is 4. GCS verbal subscore is 5. GCS motor subscore is 6.     Comments: Fluent speech, no facial droop.  Psychiatric:        Behavior: Behavior normal.     ED Results / Procedures / Treatments   Labs (all labs ordered are listed, but only abnormal results are displayed) Labs Reviewed - No data to display  EKG None  Radiology DG Clavicle Right  Result Date: 02/22/2020 CLINICAL DATA:  Neck and right arm pain. EXAM: RIGHT CLAVICLE - 2+ VIEWS COMPARISON:  None. FINDINGS: There is no evidence of fracture or other focal bone lesions. Soft tissues are unremarkable. IMPRESSION: Negative. Electronically Signed   By: Marijo Conception M.D.   On: 02/22/2020 14:58    Procedures Procedures (including critical care time)  Medications Ordered in ED Medications  lidocaine (LIDODERM) 5 % 1 patch (1 patch Transdermal Patch Applied 02/22/20 1534)    ED Course  I have reviewed the triage vital signs and the nursing notes.  Pertinent labs & imaging results that were available during my care of the patient were reviewed by me and considered in my medical decision making (see chart for details).    MDM Rules/Calculators/A&P                      History provided by patient with additional history obtained from chart review.    Patient seen and examined. Patient presents awake, alert, hemodynamically stable, afebrile,  non toxic. She is well appearing. She has tenderness to palpation of right clavicle without overlying skin changes. Lungs are clear to auscultation in all fields. She has normal work of breathing. No back pain  or midline cervical spine tenderness.  Bilateral upper extremities are neurovascularly intact.  X-ray performed of right clavicle and viewed by me shows no fracture dislocation or bony abnormalities.  Suspect patient's pain is related to MSK strain will give prescription for naproxen.  She denies chance of pregnancy. The patient appears reasonably screened and/or stabilized for discharge and I doubt any other medical condition or other [facility] requiring further screening, evaluation, or treatment in the ED at this time prior to discharge. The patient is safe for discharge with strict return precautions discussed. Recommend pcp follow up.   Portions of this note were generated with [employer]. Dictation errors may occur despite best attempts at proofreading.   Final Clinical Impression(s) / ED Diagnoses Final diagnoses:  Neck pain    Rx / DC Orders ED Discharge Orders         Ordered    naproxen (NAPROSYN) 500 MG tablet  2 times daily     02/22/20 1517           Albrizze, Harley Hallmark, PA-C 02/22/20 [place], Ankit, MD 02/23/20 [phone]

## 2020-02-22 NOTE — Discharge Instructions (Addendum)
You have been seen today for neck pain. Please read and follow all provided instructions. Return to the emergency room for worsening condition or new concerning symptoms.    1. Medications:  Prescription sent to your pharmacy for naproxen.  This is an anti-inflammatory medicine.  Please take with food so it does not cause upset stomach.  Do not take any additional ibuprofen, Aleve, Motrin, Goody powders at the same time as these are all similar medications. -You can also take Tylenol for pain.  Take as directed on the box.  Continue usual home medications Take medications as prescribed. Please review all of the medicines and only take them if you do not have an allergy to them.   2. Treatment: rest, drink plenty of fluids  3. Follow Up:  Please follow up with primary care provider by scheduling an appointment as soon as possible for a visit     It is also a possibility that you have an allergic reaction to any of the medicines that you have been prescribed - Everybody reacts differently to medications and while MOST people have no trouble with most medicines, you may have a reaction such as nausea, vomiting, rash, swelling, shortness of breath. If this is the case, please stop taking the medicine immediately and contact your physician.  ?

## 2020-02-22 NOTE — ED Notes (Signed)
Pt refused vitals 

## 2020-02-22 NOTE — ED Triage Notes (Signed)
Patient complains of 3 days of neck pain, pain with ROM and pain worse when she raises her right arm. Left earlier prior o being seen

## 2020-07-23 ENCOUNTER — Other Ambulatory Visit: Payer: Self-pay

## 2020-07-23 ENCOUNTER — Encounter (HOSPITAL_COMMUNITY): Payer: Self-pay | Admitting: Emergency Medicine

## 2020-07-23 ENCOUNTER — Ambulatory Visit (HOSPITAL_COMMUNITY)
Admission: EM | Admit: 2020-07-23 | Discharge: 2020-07-23 | Disposition: A | Discharge status: Home / Self Care | Payer: Medicaid Other | Attending: Family Medicine | Admitting: Family Medicine

## 2020-07-23 DIAGNOSIS — R030 Elevated blood-pressure reading, without diagnosis of hypertension: Secondary | ICD-10-CM

## 2020-07-23 DIAGNOSIS — R109 Unspecified abdominal pain: Secondary | ICD-10-CM | POA: Diagnosis not present

## 2020-07-23 MED ORDER — TIZANIDINE HCL 4 MG PO TABS: 4.0000 mg | ORAL_TABLET | Freq: Four times a day (QID) | ORAL | 0 refills | Status: DC | PRN

## 2020-07-23 MED ORDER — IBUPROFEN 800 MG PO TABS: 800.0000 mg | ORAL_TABLET | Freq: Three times a day (TID) | ORAL | 0 refills | Status: DC

## 2020-07-23 NOTE — Discharge Instructions (Signed)
Take ibuprofen 3 times a day with food This is for pain and inflammation Take tizanidine as needed as muscle relaxer Rest and avoid bending and lifting for a couple of days Warm compresses to area Follow-up with your personal physician regarding elevated blood pressure

## 2020-07-23 NOTE — ED Provider Notes (Signed)
[place]    CSN: [id number] Arrival date & time: 07/23/20  1810      History   Chief Complaint Chief Complaint  Patient presents with  . Flank Pain    HPI Jordan Mcbride is a 49 y.o. female.   HPI  Patient states she bent over yesterday.  When she tried to stand up she felt a pain in her left flank.  She states been present ever since then.  Hurts with bending and moving.  Hurts with deep breath.  No urinary symptoms.  No fever or chills.  No history of back problems.  Past Medical History:  Diagnosis Date  . 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 Ovarian cyst   . Umbilical hernia 01/29/8881    Patient Active Problem List   Diagnosis Date Noted  . 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 Hyperprolactinemia ([place]) 07/14/2017  . Morbid obesity ([place]) 07/14/2017  . Umbilical hernia 80/11/4915  . Internal hemorrhoids 02/19/2012    Past Surgical History:  Procedure Laterality Date  . EYE SURGERY     removed eyelid cyst  . INSERTION OF MESH N/A 02/18/2017   Procedure: INSERTION OF MESH;  Surgeon: Fanny Skates, MD;  Location: [facility];  Service: General;  Laterality: N/A;  . UMBILICAL HERNIA REPAIR N/A 02/18/2017   Procedure: REPAIR UMBILICAL HERNIA WITH MESH;  Surgeon: Fanny Skates, MD;  Location: [facility];  Service: General;  Laterality: N/A;    OB History    Gravida  5   Para  3   Term  3   Preterm      AB  2   Living  3     SAB  2   TAB      Ectopic      Multiple      Live Births  3            Home Medications    Prior to Admission medications   Medication Sig Start Date End Date Taking? Authorizing Provider  ibuprofen (ADVIL) 800 MG tablet Take 1 tablet (800 mg total) by mouth 3 (three) times daily. 07/23/20   Raylene Everts, MD  tiZANidine (ZANAFLEX) 4 MG tablet Take 1-2 tablets (4-8 mg total) by mouth every 6 (six) hours as needed for muscle spasms. 07/23/20   Raylene Everts, MD    Family History History reviewed. No pertinent family history.  Social History Social History   Tobacco Use  . Smoking status: Never Smoker  . Smokeless tobacco: Never Used  Vaping Use  . Vaping Use: Never used  Substance Use Topics  . Alcohol use: Not Currently    Comment: socially  . Drug use: No     Allergies   Patient has no known allergies.   Review of Systems Review of Systems See HPI Physical Exam Triage Vital Signs ED Triage Vitals  Enc Vitals Group     BP 07/23/20 1936 (!) 153/103     Pulse Rate 07/23/20 1936 73     Resp 07/23/20 1936 (!) 22     Temp 07/23/20 1936 98.1 F (36.7 C)     Temp Source 07/23/20 1936 Oral     SpO2 07/23/20 1936 100 %     Weight --      Height --      Head Circumference --      Peak Flow --      Pain Score 07/23/20 1934 8  Pain Loc --      Pain Edu? --      Excl. in [place]? --    No data found.  Updated Vital Signs BP (!) 153/103 (BP Location: Right Arm)   Pulse 73   Temp 98.1 F (36.7 C) (Oral)   Resp (!) 22   SpO2 100%     Physical Exam Constitutional:      General: She is not in acute distress.    Appearance: She is well-developed. She is obese.     Comments: Appears uncomfortable, mask is in place  HENT:     Head: Normocephalic and atraumatic.  Eyes:     Conjunctiva/sclera: Conjunctivae normal.     Pupils: Pupils are equal, round, and reactive to light.  Cardiovascular:     Rate and Rhythm: Normal rate.  Pulmonary:     Effort: Pulmonary effort is normal. No respiratory distress.  Abdominal:     General: There is no distension.     Palpations: Abdomen is soft.  Musculoskeletal:        General: Normal range of motion.     Cervical back: Normal range of motion.     Comments: Tenderness in the left upper paraspinous lumbar muscles to the tip of the scapula.  Muscles are tight.  Range of motion is limited.  Strength sensation range of motion reflexes are normal in extremities  Skin:    General:  Skin is warm and dry.  Neurological:     Mental Status: She is alert.     Motor: No weakness.     Gait: Gait normal.     Deep Tendon Reflexes: Reflexes normal.  Psychiatric:        Mood and Affect: Mood normal.        Behavior: Behavior normal.      UC Treatments / Results  Labs (all labs ordered are listed, but only abnormal results are displayed) Labs Reviewed - No data to display  EKG   Radiology No results found.  Procedures Procedures (including critical care time)  Medications Ordered in UC Medications - No data to display  Initial Impression / Assessment and Plan / UC Course  I have reviewed the triage vital signs and the nursing notes.  Pertinent labs & imaging results that were available during my care of the patient were reviewed by me and considered in my medical decision making (see chart for details).     Reviewed muscular back pain.  Rest.  Medicines.  Return as needed Final Clinical Impressions(s) / UC Diagnoses   Final diagnoses:  Left flank pain     Discharge Instructions     Take ibuprofen 3 times a day with food This is for pain and inflammation Take tizanidine as needed as muscle relaxer Rest and avoid bending and lifting for a couple of days Warm compresses to area Follow-up with your personal physician regarding elevated blood pressure   ED Prescriptions    Medication Sig Dispense Auth. Provider   ibuprofen (ADVIL) 800 MG tablet Take 1 tablet (800 mg total) by mouth 3 (three) times daily. 21 tablet Raylene Everts, MD   tiZANidine (ZANAFLEX) 4 MG tablet Take 1-2 tablets (4-8 mg total) by mouth every 6 (six) hours as needed for muscle spasms. 21 tablet Raylene Everts, MD     PDMP not reviewed this encounter.   Raylene Everts, MD 07/23/20 2026

## 2020-07-23 NOTE — ED Triage Notes (Signed)
Pt c/o left side pain onset yesterday. Pt states that it hurts when she bends and pain radiates to her back.

## 2020-08-07 ENCOUNTER — Ambulatory Visit (INDEPENDENT_AMBULATORY_CARE_PROVIDER_SITE_OTHER): Payer: Medicaid Other

## 2020-08-07 ENCOUNTER — Ambulatory Visit (HOSPITAL_COMMUNITY)
Admission: EM | Admit: 2020-08-07 | Discharge: 2020-08-07 | Disposition: A | Discharge status: Home / Self Care | Payer: Medicaid Other | Attending: Family Medicine | Admitting: Family Medicine

## 2020-08-07 ENCOUNTER — Encounter (HOSPITAL_COMMUNITY): Payer: Self-pay | Admitting: Emergency Medicine

## 2020-08-07 ENCOUNTER — Other Ambulatory Visit: Payer: Self-pay

## 2020-08-07 DIAGNOSIS — W19XXXA Unspecified fall, initial encounter: Secondary | ICD-10-CM

## 2020-08-07 DIAGNOSIS — S93402A Sprain of unspecified ligament of left ankle, initial encounter: Secondary | ICD-10-CM | POA: Diagnosis not present

## 2020-08-07 DIAGNOSIS — S99912A Unspecified injury of left ankle, initial encounter: Secondary | ICD-10-CM

## 2020-08-07 DIAGNOSIS — G8911 Acute pain due to trauma: Secondary | ICD-10-CM

## 2020-08-07 DIAGNOSIS — M25511 Pain in right shoulder: Secondary | ICD-10-CM

## 2020-08-07 MED ORDER — HYDROCODONE-ACETAMINOPHEN 7.5-325 MG PO TABS: 1.0000 | ORAL_TABLET | Freq: Four times a day (QID) | ORAL | 0 refills | Status: DC | PRN

## 2020-08-07 NOTE — Discharge Instructions (Signed)
Use ice and elevation to reduce pain and swelling Take pain medicine as needed Call your doctor if not improving by next week

## 2020-08-07 NOTE — ED Notes (Signed)
Called x 1 in waiting room. No answer.

## 2020-08-07 NOTE — ED Triage Notes (Signed)
PT fell at noon and injured right shoulder, right wrist, and left ankle

## 2020-08-07 NOTE — ED Provider Notes (Signed)
[place]    CSN: [id number] Arrival date & time: 08/07/20  1411      History   Chief Complaint Chief Complaint  Patient presents with  . Fall    HPI Jordan Mcbride is a 49 y.o. female.   HPI  Patient fell today.  She fell forward.  She twisted her left ankle.  She hurt her right arm and shoulder.  She is having pain with weightbearing.  Left ankle is swollen.  Patient did not hit her head.  No loss of conscious.  No headache.  Patient states she fell because of balance.  Denies any syncope, dizziness Past Medical History:  Diagnosis Date  . 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 Ovarian cyst   . Umbilical hernia 8/92/1194    Patient Active Problem List   Diagnosis Date Noted  . 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 Hyperprolactinemia ([place]) 07/14/2017  . Morbid obesity ([place]) 07/14/2017  . Umbilical hernia 17/40/8144  . Internal hemorrhoids 02/19/2012    Past Surgical History:  Procedure Laterality Date  . EYE SURGERY     removed eyelid cyst  . INSERTION OF MESH N/A 02/18/2017   Procedure: INSERTION OF MESH;  Surgeon: Fanny Skates, MD;  Location: [facility];  Service: General;  Laterality: N/A;  . UMBILICAL HERNIA REPAIR N/A 02/18/2017   Procedure: REPAIR UMBILICAL HERNIA WITH MESH;  Surgeon: Fanny Skates, MD;  Location: [facility];  Service: General;  Laterality: N/A;    OB History    Gravida  5   Para  3   Term  3   Preterm      AB  2   Living  3     SAB  2   TAB      Ectopic      Multiple      Live Births  3            Home Medications    Prior to Admission medications   Medication Sig Start Date End Date Taking? Authorizing Provider  HYDROcodone-acetaminophen (NORCO) 7.5-325 MG tablet Take 1 tablet by mouth every 6 (six) hours as needed for moderate pain. 08/07/20   Raylene Everts, MD  ibuprofen (ADVIL) 800 MG tablet Take 1 tablet (800 mg total) by mouth 3 (three) times  daily. 07/23/20   Raylene Everts, MD  tiZANidine (ZANAFLEX) 4 MG tablet Take 1-2 tablets (4-8 mg total) by mouth every 6 (six) hours as needed for muscle spasms. 07/23/20   Raylene Everts, MD    Family History No family history on file.  Social History Social History   Tobacco Use  . Smoking status: Never Smoker  . Smokeless tobacco: Never Used  Vaping Use  . Vaping Use: Never used  Substance Use Topics  . Alcohol use: Not Currently    Comment: socially  . Drug use: No     Allergies   Patient has no known allergies.   Review of Systems Review of Systems See HPI  Physical Exam Triage Vital Signs ED Triage Vitals  Enc Vitals Group     BP 08/07/20 1543 (!) 156/108     Pulse Rate 08/07/20 1543 78     Resp 08/07/20 1543 16     Temp 08/07/20 1543 98.2 F (36.8 C)     Temp Source 08/07/20 1543 Oral     SpO2 --      Weight --      Height --  Head Circumference --      Peak Flow --      Pain Score 08/07/20 1542 10     Pain Loc --      Pain Edu? --      Excl. in [place]? --    No data found.  Updated Vital Signs BP (!) 156/108   Pulse 78   Temp 98.2 F (36.8 C) (Oral)   Resp 16   LMP 07/02/2020      Physical Exam Constitutional:      General: She is in acute distress.     Appearance: She is well-developed. She is obese.     Comments: In wheelchair.  Acute emotional distress.  Crying  HENT:     Head: Normocephalic and atraumatic.     Mouth/Throat:     Comments: Mask is in place Eyes:     Conjunctiva/sclera: Conjunctivae normal.     Pupils: Pupils are equal, round, and reactive to light.  Cardiovascular:     Rate and Rhythm: Normal rate.  Pulmonary:     Effort: Pulmonary effort is normal. No respiratory distress.  Abdominal:     Palpations: Abdomen is soft.  Musculoskeletal:        General: Normal range of motion.     Cervical back: Normal range of motion.     Comments: Left ankle has pes planus.  Soft tissue swelling laterally.  Tenderness  lateral malleolus.  Limited range of motion.  No neurovascular compromise.  Both upper extremities have normal strength sensation range of motion reflexes.  No bony tenderness.  Skin:    General: Skin is warm and dry.  Neurological:     Mental Status: She is alert.  Psychiatric:        Behavior: Behavior normal.      UC Treatments / Results  Labs (all labs ordered are listed, but only abnormal results are displayed) Labs Reviewed - No data to display  EKG   Radiology DG Ankle Complete Left  Result Date: 08/07/2020 CLINICAL DATA:  49 year old female with fall and trauma to the left ankle. EXAM: LEFT ANKLE COMPLETE - 3+ VIEW COMPARISON:  None. FINDINGS: There is no evidence of fracture, dislocation, or joint effusion. There is no evidence of arthropathy or other focal bone abnormality. Soft tissues are unremarkable. IMPRESSION: Negative. Electronically Signed   By: Anner Crete M.D.   On: 08/07/2020 16:32    Procedures Procedures (including critical care time)  Medications Ordered in UC Medications - No data to display  Initial Impression / Assessment and Plan / UC Course  I have reviewed the triage vital signs and the nursing notes.  Pertinent labs & imaging results that were available during my care of the patient were reviewed by me and considered in my medical decision making (see chart for details).      Final Clinical Impressions(s) / UC Diagnoses   Final diagnoses:  Moderate ankle sprain, left, initial encounter  Acute shoulder pain due to trauma, right  Fall, initial encounter     Discharge Instructions     Use ice and elevation to reduce pain and swelling Take pain medicine as needed Call your doctor if not improving by next week    ED Prescriptions    Medication Sig Dispense Auth. Provider   HYDROcodone-acetaminophen (NORCO) 7.5-325 MG tablet Take 1 tablet by mouth every 6 (six) hours as needed for moderate pain. 15 tablet Raylene Everts, MD       I have reviewed the  PDMP during this encounter.   Raylene Everts, MD 08/07/20 [id number]

## 2020-08-22 ENCOUNTER — Encounter: Payer: Self-pay | Admitting: Internal Medicine

## 2020-08-22 ENCOUNTER — Ambulatory Visit (INDEPENDENT_AMBULATORY_CARE_PROVIDER_SITE_OTHER): Payer: Medicaid Other | Admitting: Internal Medicine

## 2020-08-22 ENCOUNTER — Other Ambulatory Visit: Payer: Self-pay

## 2020-08-22 VITALS — BP 138/92 | HR 77 | Ht 65.0 in | Wt 249.6 lb

## 2020-08-22 DIAGNOSIS — E221 Hyperprolactinemia: Secondary | ICD-10-CM

## 2020-08-22 DIAGNOSIS — D352 Benign neoplasm of pituitary gland: Secondary | ICD-10-CM | POA: Diagnosis not present

## 2020-08-22 NOTE — Patient Instructions (Signed)
Please stop at the lab.  Please come back for a follow-up appointment in 1 year, but likely sooner for labs >> I will let you know.

## 2020-08-22 NOTE — Progress Notes (Signed)
Patient ID: Jordan Mcbride, female   DOB: 1971/03/02, 49 y.o.   MRN: [id number]   This visit occurred during the SARS-CoV-2 public health emergency.  Safety protocols were in place, including screening questions prior to the visit, additional usage of staff PPE, and extensive cleaning of exam room while observing appropriate contact time as indicated for disinfecting solutions.   HPI  Jordan Mcbride is a 49 y.o.-year-old female, returning for follow-up for pituitary adenoma and hyperprolactinemia.  Last visit 1 year ago.  Reviewed and addended history: Patient was diagnosed with hyperprolactinemia in 2001 during investigation for fertility.  At that time, an MRI showed a pituitary adenoma.  She was started on bromocriptine and she was on this until 2017 when she changed to Cabergoline: 1 tablet twice a day(!!).  She started to experience  numbness in face, eyelid skin changes>> was on it for 1 year >> then back on Bromocriptine 2.5 mg a day >> ran out in 10/2016.  Restarting Cabergoline 0.25 mg every other day initially, then, except prolactin decreased significantly 11/2017, we decreased her on Cabergoline dose to 0.25 mg twice a week.  Afterwards, we decreased the dose to 0.25 mg once a week.  However, at our last visit in 07/2019, she was off Cabergoline for 5 months after she ran out.  Her prolactin level was normal and we continued her off the medication.  Reviewed her imaging tests: Pituitary MRI (06/17/2012): Stable central pituitary adenoma of 8 x 9 x 11 mm (previously 8 x 10 x 10 mm) pituitary gland appears to be displaced upwards,  with effacement of the optic chiasm but without significant mass-effect.  Pituitary MRI (10/18/2017): Hypoenhancing pituitary adenoma shows interval growth, now measuring 10 x 12 x 12 mm. Mild flattening of the undersurface of the optic chiasm without compression of the chiasm.   Pituitary MRI (01/12/2020): Mild interval decrease in size of the pituitary tumor  to 10 x 8 x 6 mm now  Reviewed her prolactin levels: Lab Results  Component Value Date   PROLACTIN 6.1 08/23/2019   PROLACTIN 9.0 04/12/2018   PROLACTIN 2.3 12/21/2017   PROLACTIN 158.2 (H) 10/14/2017  06/08/2017: Prolactin 191.5  Other pertinent labs: 06/08/2017: LH 2.6, FSH 11.8, TSH 1.49  In 2018-2019, she was sent me that she was interested in getting pregnant again.  However, before last visit, she decided against the future pregnancy.  At this visit, she denies: Galactorrhea Headaches Blurry or double vision Irregular menstrual cycles - but skipped a cycle this mo - UPT negative.  But does have a h/o infertility, history of headaches-improved.  Pt. also has a history of umbilical hernia - had Sx 2018.  ROS: Constitutional: + weight gain/no weight loss, no fatigue, no subjective hyperthermia, no subjective hypothermia Eyes: no blurry vision, no xerophthalmia ENT: no sore throat, no nodules palpated in neck, no dysphagia, no odynophagia, no hoarseness Cardiovascular: no CP/no SOB/no palpitations/no leg swelling Respiratory: no cough/no SOB/no wheezing Gastrointestinal: no N/no V/no D/no C/no acid reflux Musculoskeletal: no muscle aches/no joint aches Skin: no rashes, no hair loss Neurological: no tremors/no numbness/no tingling/no dizziness  I reviewed pt's medications, allergies, PMH, social hx, family hx, and changes were documented in the history of present illness. Otherwise, unchanged from my initial visit note.  Past Medical History:  Diagnosis Date  . 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 Ovarian cyst   . Umbilical hernia 12/10/9700   Past Surgical History:  Procedure Laterality Date  .  EYE SURGERY     removed eyelid cyst  . INSERTION OF MESH N/A 02/18/2017   Procedure: INSERTION OF MESH;  Surgeon: Fanny Skates, MD;  Location: [facility];  Service: General;  Laterality: N/A;  . UMBILICAL HERNIA REPAIR N/A 02/18/2017   Procedure:  REPAIR UMBILICAL HERNIA WITH MESH;  Surgeon: Fanny Skates, MD;  Location: [place];  Service: General;  Laterality: N/A;   Social History   Socioeconomic History  . Marital status: Single    Spouse name: Not on file  . Number of children: 4        Occupational History  . braider  Tobacco Use  . Smoking status: Never Smoker  . Smokeless tobacco: Never Used  Substance and Sexual Activity  . Alcohol use: Yes    Comment: socially  . Drug use: No   Current Outpatient Medications on File Prior to Visit  Medication Sig Dispense Refill  . HYDROcodone-acetaminophen (NORCO) 7.5-325 MG tablet Take 1 tablet by mouth every 6 (six) hours as needed for moderate pain. 15 tablet 0  . ibuprofen (ADVIL) 800 MG tablet Take 1 tablet (800 mg total) by mouth 3 (three) times daily. 21 tablet 0  . tiZANidine (ZANAFLEX) 4 MG tablet Take 1-2 tablets (4-8 mg total) by mouth every 6 (six) hours as needed for muscle spasms. 21 tablet 0   No current facility-administered medications on file prior to visit.   No Known Allergies   No family history on file.  PE: BP (!) 138/92   Pulse 77   Ht 5\' 5"  (1.651 m)   Wt 249 lb 9.6 oz (113.2 kg)   SpO2 99%   BMI 41.54 kg/m  Wt Readings from Last 3 Encounters:  08/22/20 249 lb 9.6 oz (113.2 kg)  12/15/19 243 lb (110.2 kg)  08/23/19 241 lb (109.3 kg)   Constitutional: overweight, in NAD Eyes: PERRLA, EOMI, no exophthalmos ENT: moist mucous membranes, no thyromegaly, no cervical lymphadenopathy Cardiovascular: RRR, No MRG Respiratory: CTA B Gastrointestinal: abdomen soft, NT, ND, BS+ Musculoskeletal: no deformities, strength intact in all 4 Skin: moist, warm, no rashes Neurological: no tremor with outstretched hands, DTR normal in all 4  ASSESSMENT: 1. Hyperprolactinemia  2. Pituitary adenoma  PLAN:  1. Patient with history of high prolactin level and a history of pituitary microadenoma since 2001, and increased on the MRI from  09/2017, but slightly decreased on the MRI from 12/2019 -Patient has been on bromocriptine in the past for many years and then switched to Cabergoline but on the very high dose, of 1 tablet twice a day, which she could not tolerate due to dizziness.  She did try to take it for proximately 1 year, but then switched back to bromocriptine, of which she ran out in 10/2016.  2 months later she started having menstrual cycles and slowly started to experience galactorrhea.  In 09/2017, her prolactin was very high, at 158.2 and we started cabergoline 0.25 mg every other day.  Her prolactin level was subsequently excellent, at 2.3, so we decreased the dose to 0.25 mg twice a week.  In 03/2018, we decrease the dose further to 0.25 mg once a week.  She did not return for labs after this change... At last visit, in 07/2019, she was telling me that she ran out of Cabergoline in 02/2019.  She did not have galactorrhea, increased headaches or irregular menstrual cycles.  Surprisingly, her prolactin level was normal.  We did not restart Cabergoline at that  time. -At today's visit, she denies galactorrhea, headaches, vision problems but does mention weight gain and also skipped her menstrual cycle this month.  Prior to this, menses were regular. -We will recheck her prolactin level today to see if she needs to restart the dopamine agonist -I will see her back in a year with probably sooner for labs  2.  Pituitary adenoma -Please also see problem #1 -No headaches, vision problems -Her latest MRI from 01/12/2020 showed a mild interval decrease in size of her pituitary tumor to 10 x 8 x 6 mm.  This is excellent, and most likely related to Cabergoline effect. -At today's visit, we will recheck prolactin and see if we need to restart Cabergoline  Refuses the flu shot today.   Component     Latest Ref Rng & Units 08/22/2020  Prolactin     ng/mL 28.5  Prolactin level is higher, but still in the normal range.  I would like  to recheck her prolactin in 3 months and at that time we may need to restart Cabergoline.  Philemon Kingdom, MD PhD [facility] Endocrinology

## 2020-08-23 LAB — PROLACTIN: Prolactin: 28.5 ng/mL

## 2021-08-28 ENCOUNTER — Ambulatory Visit: Payer: Medicaid Other | Admitting: Internal Medicine

## 2021-08-28 NOTE — Progress Notes (Deleted)
Patient ID: Jordan Mcbride, female   DOB: Nov 22, 1970, 50 y.o.   MRN: [id number]   This visit occurred during the SARS-CoV-2 public health emergency.  Safety protocols were in place, including screening questions prior to the visit, additional usage of staff PPE, and extensive cleaning of exam room while observing appropriate contact time as indicated for disinfecting solutions.   HPI  Jordan Mcbride is a 50 y.o.-year-old female, returning for follow-up for pituitary adenoma and hyperprolactinemia.  Last visit 1 year ago.  Interim history: At this visit, she denies galactorrhea, headaches, double vision or blurry vision, irregular menstrual cycles.  Reviewed and addended history: Patient was diagnosed with hyperprolactinemia in 2001 during investigation for fertility.  At that time, an MRI showed a pituitary adenoma.    She was started on bromocriptine and she was on this until 2017 when she changed to Cabergoline: 1 tablet twice a day(!!).  She started to experience  numbness in face, eyelid skin changes>> was on it for 1 year >> then back on Bromocriptine 2.5 mg a day >> ran out in 10/2016.    We restarted Cabergoline 0.25 mg every other day initially, then, except prolactin decreased significantly 11/2017, we decreased her on Cabergoline dose to 0.25 mg twice a week.  Afterwards, we decreased the dose to 0.25 mg once a week.  However, at our visit in 07/2019, she was off Cabergoline for 5 months after she ran out.  Her prolactin level was normal and we continued her off the medication.  Reviewed her imaging test reports: Pituitary MRI (06/17/2012): Stable central pituitary adenoma of 8 x 9 x 11 mm (previously 8 x 10 x 10 mm) pituitary gland appears to be displaced upwards,  with effacement of the optic chiasm but without significant mass-effect.  Pituitary MRI (10/18/2017): Hypoenhancing pituitary adenoma shows interval growth, now measuring 10 x 12 x 12 mm. Mild flattening of the  undersurface of the optic chiasm without compression of the chiasm.   Pituitary MRI (01/12/2020): Mild interval decrease in size of the pituitary tumor to 10 x 8 x 6 mm now  Reviewed her prolactin levels: Lab Results  Component Value Date   PROLACTIN 28.5 08/22/2020   PROLACTIN 6.1 08/23/2019   PROLACTIN 9.0 04/12/2018   PROLACTIN 2.3 12/21/2017   PROLACTIN 158.2 (H) 10/14/2017  06/08/2017: Prolactin 191.5  Other pertinent labs: 06/08/2017: LH 2.6, FSH 11.8, TSH 1.49  In 2018-2019, she was interested in getting pregnant again.  However, since then, she decided against a future pregnancy.  Pt. also has a history of umbilical hernia - had Sx 2018.  ROS: + see HPI  I reviewed pt's medications, allergies, PMH, social hx, family hx, and changes were documented in the history of present illness. Otherwise, unchanged from my initial visit note.  Past Medical History:  Diagnosis Date   Brain tumor ([place])    micro-pituitary adenoma   Ovarian cyst    Umbilical hernia 7/79/3903   Past Surgical History:  Procedure Laterality Date   EYE SURGERY     removed eyelid cyst   INSERTION OF MESH N/A 02/18/2017   Procedure: INSERTION OF MESH;  Surgeon: Jordan Skates, MD;  Location: [place];  Service: General;  Laterality: N/A;   UMBILICAL HERNIA REPAIR N/A 02/18/2017   Procedure: REPAIR UMBILICAL HERNIA WITH MESH;  Surgeon: Jordan Skates, MD;  Location: [place];  Service: General;  Laterality: N/A;   Social History   Socioeconomic History   Marital status:  Single    Spouse name: Not on file   Number of children: 4        Occupational History   braider  Tobacco Use   Smoking status: Never Smoker   Smokeless tobacco: Never Used  Substance and Sexual Activity   Alcohol use: Yes    Comment: socially   Drug use: No   Current Outpatient Medications on File Prior to Visit  Medication Sig Dispense Refill   HYDROcodone-acetaminophen (NORCO) 7.5-325 MG  tablet Take 1 tablet by mouth every 6 (six) hours as needed for moderate pain. 15 tablet 0   ibuprofen (ADVIL) 800 MG tablet Take 1 tablet (800 mg total) by mouth 3 (three) times daily. 21 tablet 0   tiZANidine (ZANAFLEX) 4 MG tablet Take 1-2 tablets (4-8 mg total) by mouth every 6 (six) hours as needed for muscle spasms. 21 tablet 0   No current facility-administered medications on file prior to visit.   No Known Allergies   No family history on file.  PE: There were no vitals taken for this visit. Wt Readings from Last 3 Encounters:  08/22/20 249 lb 9.6 oz (113.2 kg)  12/15/19 243 lb (110.2 kg)  08/23/19 241 lb (109.3 kg)   Constitutional: overweight, in NAD Eyes: PERRLA, EOMI, no exophthalmos ENT: moist mucous membranes, no thyromegaly, no cervical lymphadenopathy Cardiovascular: RRR, No MRG Respiratory: CTA B Musculoskeletal: no deformities, strength intact in all 4 Skin: moist, warm, no rashes Neurological: no tremor with outstretched hands, DTR normal in all 4  ASSESSMENT: 1. Hyperprolactinemia  2. Pituitary adenoma  PLAN:  1. Patient with history of a high prolactin level and a history of pituitary microadenoma since 2001, increased on the MRI from 09/2017, but slightly decreased in size on the MRI from 12/2019 -She has been on bromocriptine in the past, for many years, then switched to Cabergoline but on a very high dose, 1 tablet twice a day, which she could not tolerate due to dizziness.  She did try to take it for approximately 1 year but then switched back to bromocriptine, which she ran out in 10/2016.  2 months later, she started to have irregular menstrual cycles and slowly started to experience galactorrhea.  In 09/2017, her prolactin was very high, at 158.2 and we started Cabergoline 0.25 mg every other day.  Her prolactin level decreased to 2.3 so we decreased the Cabergoline dose to 0.25 mg twice a week. In 03/2018, we decrease the dose further to 0.25 mg once a  week.  -At our visit from 07/2019, she described that ran out of Cabergoline in 02/2019 and did not have any symptoms afterwards so she did not ask for a refill.  Surprisingly, prolactin level was normal.  We did not have to restart Cabergoline so far. -At last visit, she denied galactorrhea, headaches, vision problems but did have weight gain and skipped 1 menstrual cycle.  At today's visit, menstrual cycles are regular and she again denies the symptoms. -At last visit, the prolactin level was slightly higher, but still normal, at 28.5, and we discussed about returning for labs in 3 months to see if prolactin increases but she did not return... -At today's visit we will recheck her prolactin level to see if she needs to restart the dopamine agonist -I we will see her back in a year but possibly sooner for labs  2.  Pituitary adenoma -Also please see problem #1 -She denies headaches, vision problems -Reviewed the report of the latest MRI from  01/12/2020 which showed a mild interval decrease in size of her pituitary tumor to 10 x 8 x 6 mm.  This is most likely related to Cabergoline effect. -At this visit, we will recheck her prolactin and see if we need to restart Cabergoline.  Philemon Kingdom, MD PhD [facility]

## 2021-09-23 ENCOUNTER — Other Ambulatory Visit: Payer: Self-pay

## 2021-09-23 ENCOUNTER — Ambulatory Visit (HOSPITAL_COMMUNITY)
Admission: EM | Admit: 2021-09-23 | Discharge: 2021-09-23 | Disposition: A | Discharge status: Home / Self Care | Payer: Medicaid Other | Attending: Family Medicine | Admitting: Family Medicine

## 2021-09-23 ENCOUNTER — Encounter (HOSPITAL_COMMUNITY): Payer: Self-pay

## 2021-09-23 DIAGNOSIS — U071 COVID-19: Secondary | ICD-10-CM

## 2021-09-23 MED ORDER — METOPROLOL TARTRATE 25 MG PO TABS: 25.0000 mg | ORAL_TABLET | Freq: Every day | ORAL | 0 refills | Status: DC

## 2021-09-23 MED ORDER — NIRMATRELVIR/RITONAVIR (PAXLOVID)TABLET: ORAL_TABLET | ORAL | 0 refills | Status: DC

## 2021-09-23 NOTE — Discharge Instructions (Addendum)
Take paxlovid according to package instructions.  Take metoprolol 25 mg 1 daily for high blood pressure. See your regular doctor in another 2 weeks or so.

## 2021-09-23 NOTE — ED Provider Notes (Addendum)
[place]    CSN: [id number] Arrival date & time: 09/23/21  1542      History   Chief Complaint Chief Complaint  Patient presents with   Covid Positive    HPI Jordan Mcbride is a 50 y.o. female.   HPI Here with a h/o cough, f/c, congestion and h/a since 09/20/21. She did a home COVID test today and was positive. Has felt some short of breath.  Her bp is 166/111 today.  No n/v/d.  Past Medical History:  Diagnosis Date   Brain tumor [facility])    micro-pituitary adenoma   Ovarian cyst    Umbilical hernia 0/93/2355    Patient Active Problem List   Diagnosis Date Noted   Prolactinoma ([place]) 10/13/2017   Hyperprolactinemia ([place]) 07/14/2017   Morbid obesity ([place]) 73/22/0254   Umbilical hernia 27/02/2375   Internal hemorrhoids 02/19/2012    Past Surgical History:  Procedure Laterality Date   EYE SURGERY     removed eyelid cyst   INSERTION OF MESH N/A 02/18/2017   Procedure: INSERTION OF MESH;  Surgeon: Fanny Skates, MD;  Location: [place];  Service: General;  Laterality: N/A;   UMBILICAL HERNIA REPAIR N/A 02/18/2017   Procedure: REPAIR UMBILICAL HERNIA WITH MESH;  Surgeon: Fanny Skates, MD;  Location: [place];  Service: General;  Laterality: N/A;    OB History     Gravida  5   Para  3   Term  3   Preterm      AB  2   Living  3      SAB  2   IAB      Ectopic      Multiple      Live Births  3            Home Medications    Prior to Admission medications   Medication Sig Start Date End Date Taking? Authorizing Provider  metoprolol tartrate (LOPRESSOR) 25 MG tablet Take 1 tablet (25 mg total) by mouth daily. 09/23/21  Yes Barrett Henle, MD  nirmatrelvir/ritonavir EUA (PAXLOVID) 20 x 150 MG & 10 x 100MG  TABS Take nirmatrelvir (150 mg) two tablets twice daily for 5 days and ritonavir (100 mg) one tablet twice daily for 5 days. 09/23/21  Yes Barrett Henle, MD   HYDROcodone-acetaminophen (NORCO) 7.5-325 MG tablet Take 1 tablet by mouth every 6 (six) hours as needed for moderate pain. 08/07/20   Raylene Everts, MD  ibuprofen (ADVIL) 800 MG tablet Take 1 tablet (800 mg total) by mouth 3 (three) times daily. 07/23/20   Raylene Everts, MD  tiZANidine (ZANAFLEX) 4 MG tablet Take 1-2 tablets (4-8 mg total) by mouth every 6 (six) hours as needed for muscle spasms. 07/23/20   Raylene Everts, MD    Family History History reviewed. No pertinent family history.  Social History Social History   Tobacco Use   Smoking status: Never   Smokeless tobacco: Never  Vaping Use   Vaping Use: Never used  Substance Use Topics   Alcohol use: Not Currently    Comment: socially   Drug use: No     Allergies   Patient has no known allergies.   Review of Systems Review of Systems   Physical Exam Triage Vital Signs ED Triage Vitals  Enc Vitals Group     BP 09/23/21 1731 (!) 166/111     Pulse Rate 09/23/21 1731 70     Resp 09/23/21  1731 18     Temp 09/23/21 1731 98.8 F (37.1 C)     Temp Source 09/23/21 1731 Oral     SpO2 09/23/21 1731 97 %     Weight --      Height --      Head Circumference --      Peak Flow --      Pain Score 09/23/21 1730 0     Pain Loc --      Pain Edu? --      Excl. in [place]? --    No data found.  Updated Vital Signs BP (!) 156/106 (BP Location: Left Arm)    Pulse 70    Temp 98.8 F (37.1 C) (Oral)    Resp 18    LMP 08/18/2021 (Exact Date)    SpO2 97%   Visual Acuity Right Eye Distance:   Left Eye Distance:   Bilateral Distance:    Right Eye Near:   Left Eye Near:    Bilateral Near:     Physical Exam Vitals reviewed.  Constitutional:      General: She is not in acute distress.    Appearance: She is not toxic-appearing.  HENT:     Right Ear: Tympanic membrane normal.     Left Ear: Tympanic membrane normal.     Nose: Nose normal.     Mouth/Throat:     Mouth: Mucous membranes are moist.     Pharynx:  No oropharyngeal exudate or posterior oropharyngeal erythema.  Eyes:     Extraocular Movements: Extraocular movements intact.     Conjunctiva/sclera: Conjunctivae normal.     Pupils: Pupils are equal, round, and reactive to light.  Cardiovascular:     Rate and Rhythm: Normal rate and regular rhythm.     Heart sounds: No murmur heard. Pulmonary:     Effort: Pulmonary effort is normal.     Breath sounds: No wheezing, rhonchi or rales.  Musculoskeletal:     Cervical back: Neck supple.  Lymphadenopathy:     Cervical: No cervical adenopathy.  Skin:    Coloration: Skin is not jaundiced or pale.  Neurological:     General: No focal deficit present.     Mental Status: She is alert. She is disoriented.  Psychiatric:        Behavior: Behavior normal.     UC Treatments / Results  Labs (all labs ordered are listed, but only abnormal results are displayed) Labs Reviewed - No data to display  EKG   Radiology No results found.  Procedures Procedures (including critical care time)  Medications Ordered in UC Medications - No data to display  Initial Impression / Assessment and Plan / UC Course  I have reviewed the triage vital signs and the nursing notes.  Pertinent labs & imaging results that were available during my care of the patient were reviewed by me and considered in my medical decision making (see chart for details).     With her elevated BMI, will treat with paxlovid since she is within the 3 day window.  Repeat bp was high too, so metoprolol begun Final Clinical Impressions(s) / UC Diagnoses   Final diagnoses:  COVID     Discharge Instructions      Take paxlovid according to package instructions.  Take metoprolol 25 mg 1 daily for high blood pressure. See your regular doctor in another 2 weeks or so.     ED Prescriptions     Medication Sig Dispense Auth. Provider  nirmatrelvir/ritonavir EUA (PAXLOVID) 20 x 150 MG & 10 x 100MG  TABS Take nirmatrelvir  (150 mg) two tablets twice daily for 5 days and ritonavir (100 mg) one tablet twice daily for 5 days. 30 tablet Barrett Henle, MD   metoprolol tartrate (LOPRESSOR) 25 MG tablet Take 1 tablet (25 mg total) by mouth daily. 30 tablet Cait Locust, Gwenlyn Perking, MD      I have reviewed the PDMP during this encounter.   Barrett Henle, MD 09/23/21 1753    Barrett Henle, MD 09/23/21 [phone]

## 2021-09-23 NOTE — ED Triage Notes (Signed)
Pt presents with c/o cough, weakness in knee and hands. States she has a headache and states she tested positive for COVID today.

## 2021-11-08 ENCOUNTER — Other Ambulatory Visit: Payer: Self-pay

## 2021-11-08 ENCOUNTER — Other Ambulatory Visit: Payer: Self-pay | Admitting: Family Medicine

## 2021-11-08 ENCOUNTER — Encounter (HOSPITAL_COMMUNITY): Payer: Self-pay

## 2021-11-08 ENCOUNTER — Ambulatory Visit (HOSPITAL_COMMUNITY)
Admission: EM | Admit: 2021-11-08 | Discharge: 2021-11-08 | Disposition: A | Discharge status: Home / Self Care | Payer: Medicaid Other | Attending: Family Medicine | Admitting: Family Medicine

## 2021-11-08 DIAGNOSIS — J028 Acute pharyngitis due to other specified organisms: Secondary | ICD-10-CM | POA: Insufficient documentation

## 2021-11-08 DIAGNOSIS — B9789 Other viral agents as the cause of diseases classified elsewhere: Secondary | ICD-10-CM | POA: Diagnosis not present

## 2021-11-08 DIAGNOSIS — Z20822 Contact with and (suspected) exposure to covid-19: Secondary | ICD-10-CM | POA: Insufficient documentation

## 2021-11-08 DIAGNOSIS — J069 Acute upper respiratory infection, unspecified: Secondary | ICD-10-CM | POA: Diagnosis not present

## 2021-11-08 LAB — SARS CORONAVIRUS 2 (TAT 6-24 HRS): SARS Coronavirus 2: NEGATIVE

## 2021-11-08 MED ORDER — LIDOCAINE VISCOUS HCL 2 % MT SOLN: 15.0000 mL | OROMUCOSAL | 0 refills | Status: DC | PRN

## 2021-11-08 MED ORDER — LEVOCETIRIZINE DIHYDROCHLORIDE 5 MG PO TABS: 5.0000 mg | ORAL_TABLET | Freq: Every day | ORAL | 0 refills | Status: DC

## 2021-11-08 NOTE — ED Triage Notes (Signed)
Pt presents with c/o purchasing and drinking water this morning that "did not taste normal" pt states that since drinking this water she has a "heaviness in her throat"

## 2021-11-08 NOTE — Discharge Instructions (Signed)
COVID test will result within 24 to 48 hours.  If your COVID test is positive you will need to quarantine for total 5 days.  In the meantime treating you for viral sore throat along with a viral upper respiratory illness.  Take medication as prescribed.  If any your symptoms worsen or do not readily improve return for evaluation.

## 2021-11-08 NOTE — ED Provider Notes (Signed)
[place]    CSN: [id number] Arrival date & time: 11/08/21  1539      History   Chief Complaint Chief Complaint  Patient presents with   change in taste    HPI Jordan Mcbride is a 51 y.o. female.     Patient presents for evaluation of acute cough, sore throat, post nasal drainage, and altered taste. Symptoms of cough present x 2 days. Changes in taste and sore throat today only. No known sick contacts.  Denies shortness of breath or wheezing. Past Medical History:  Diagnosis Date   Brain tumor [facility])    micro-pituitary adenoma   Ovarian cyst    Umbilical hernia 7/67/2094    Patient Active Problem List   Diagnosis Date Noted   Prolactinoma ([place]) 10/13/2017   Hyperprolactinemia ([place]) 07/14/2017   Morbid obesity ([place]) 70/96/2836   Umbilical hernia 62/94/7654   Internal hemorrhoids 02/19/2012    Past Surgical History:  Procedure Laterality Date   EYE SURGERY     removed eyelid cyst   INSERTION OF MESH N/A 02/18/2017   Procedure: INSERTION OF MESH;  Surgeon: Fanny Skates, MD;  Location: [place];  Service: General;  Laterality: N/A;   UMBILICAL HERNIA REPAIR N/A 02/18/2017   Procedure: REPAIR UMBILICAL HERNIA WITH MESH;  Surgeon: Fanny Skates, MD;  Location: [place];  Service: General;  Laterality: N/A;    OB History     Gravida  5   Para  3   Term  3   Preterm      AB  2   Living  3      SAB  2   IAB      Ectopic      Multiple      Live Births  3            Home Medications    Prior to Admission medications   Medication Sig Start Date End Date Taking? Authorizing Provider  HYDROcodone-acetaminophen (NORCO) 7.5-325 MG tablet Take 1 tablet by mouth every 6 (six) hours as needed for moderate pain. Patient not taking: Reported on 11/08/2021 08/07/20   Raylene Everts, MD  ibuprofen (ADVIL) 800 MG tablet Take 1 tablet (800 mg total) by mouth 3 (three) times daily. Patient not taking:  Reported on 11/08/2021 07/23/20   Raylene Everts, MD  metoprolol tartrate (LOPRESSOR) 25 MG tablet Take 1 tablet (25 mg total) by mouth daily. Patient not taking: Reported on 11/08/2021 09/23/21   Barrett Henle, MD  nirmatrelvir/ritonavir EUA (PAXLOVID) 20 x 150 MG & 10 x 100MG  TABS Take nirmatrelvir (150 mg) two tablets twice daily for 5 days and ritonavir (100 mg) one tablet twice daily for 5 days. Patient not taking: Reported on 11/08/2021 09/23/21   Barrett Henle, MD  tiZANidine (ZANAFLEX) 4 MG tablet Take 1-2 tablets (4-8 mg total) by mouth every 6 (six) hours as needed for muscle spasms. Patient not taking: Reported on 11/08/2021 07/23/20   Raylene Everts, MD    Family History History reviewed. No pertinent family history.  Social History Social History   Tobacco Use   Smoking status: Never   Smokeless tobacco: Never  Vaping Use   Vaping Use: Never used  Substance Use Topics   Alcohol use: Not Currently    Comment: socially   Drug use: No     Allergies   Patient has no known allergies.   Review of Systems Review of Systems Pertinent negatives  listed in HPI  Physical Exam Triage Vital Signs ED Triage Vitals  Enc Vitals Group     BP 11/08/21 1620 (!) 156/86     Pulse Rate 11/08/21 1620 82     Resp 11/08/21 1620 14     Temp 11/08/21 1620 97.7 F (36.5 C)     Temp Source 11/08/21 1620 Oral     SpO2 11/08/21 1620 99 %     Weight --      Height --      Head Circumference --      Peak Flow --      Pain Score 11/08/21 1623 0     Pain Loc --      Pain Edu? --      Excl. in [place]? --    No data found.  Updated Vital Signs BP (!) 156/86 (BP Location: Right Arm)    Pulse 82    Temp 97.7 F (36.5 C) (Oral)    Resp 14    SpO2 99%   Visual Acuity Right Eye Distance:   Left Eye Distance:   Bilateral Distance:    Right Eye Near:   Left Eye Near:    Bilateral Near:     Physical Exam General Appearance:    Alert, acutely ill-appearing, cooperative,  no distress  HENT:   Normocephalic, nares with mucosa throat normal without  erythema or exudate and nasal mucosa congested  Eyes:    PERRL, conjunctiva/corneas clear, EOM's intact       Lungs:     Clear to auscultation bilaterally, respirations unlabored  Heart:    Regular rate and rhythm  Neurologic:   Awake, alert, oriented x 3. No apparent focal neurological           defect.         UC Treatments / Results  Labs (all labs ordered are listed, but only abnormal results are displayed) Labs Reviewed - No data to display  EKG   Radiology No results found.  Procedures Procedures (including critical care time)  Medications Ordered in UC Medications - No data to display  Initial Impression / Assessment and Plan / UC Course  I have reviewed the triage vital signs and the nursing notes.  Pertinent labs & imaging results that were available during my care of the patient were reviewed by me and considered in my medical decision making (see chart for details).    Sore throat and viral URI with cough COVID-19 test pending. Symptom management warranted only per discharge medication orders. RTC PRN Final Clinical Impressions(s) / UC Diagnoses   Final diagnoses:  Sore throat (viral)  Viral URI with cough   Discharge Instructions   None    ED Prescriptions     Medication Sig Dispense Auth. Provider   lidocaine (XYLOCAINE) 2 % solution Use as directed 15 mLs in the mouth or throat every 3 (three) hours as needed for mouth pain (mix with 4 ounces of water, gargle and spit). 100 mL Scot Jun, FNP   levocetirizine (XYZAL) 5 MG tablet Take 1 tablet (5 mg total) by mouth at bedtime. 30 tablet Scot Jun, FNP      PDMP not reviewed this encounter.   Scot Jun, FNP 11/08/21 [phone]

## 2021-12-16 ENCOUNTER — Other Ambulatory Visit: Payer: Self-pay

## 2021-12-16 ENCOUNTER — Ambulatory Visit (HOSPITAL_COMMUNITY)
Admission: EM | Admit: 2021-12-16 | Discharge: 2021-12-16 | Disposition: A | Discharge status: Home / Self Care | Payer: Medicaid Other | Attending: Physician Assistant | Admitting: Physician Assistant

## 2021-12-16 ENCOUNTER — Encounter (HOSPITAL_COMMUNITY): Payer: Self-pay | Admitting: Emergency Medicine

## 2021-12-16 ENCOUNTER — Ambulatory Visit (INDEPENDENT_AMBULATORY_CARE_PROVIDER_SITE_OTHER): Payer: Medicaid Other

## 2021-12-16 DIAGNOSIS — R030 Elevated blood-pressure reading, without diagnosis of hypertension: Secondary | ICD-10-CM | POA: Diagnosis not present

## 2021-12-16 DIAGNOSIS — M546 Pain in thoracic spine: Secondary | ICD-10-CM

## 2021-12-16 DIAGNOSIS — Z3202 Encounter for pregnancy test, result negative: Secondary | ICD-10-CM

## 2021-12-16 LAB — POCT URINALYSIS DIPSTICK, ED / UC
Bilirubin Urine: NEGATIVE
Glucose, UA: NEGATIVE mg/dL
Hgb urine dipstick: NEGATIVE
Ketones, ur: NEGATIVE mg/dL
Leukocytes,Ua: NEGATIVE
Nitrite: NEGATIVE
Protein, ur: 30 mg/dL — AB
Specific Gravity, Urine: 1.025 (ref 1.005–1.030)
Urobilinogen, UA: 0.2 mg/dL (ref 0.0–1.0)
pH: 7 (ref 5.0–8.0)

## 2021-12-16 LAB — POC URINE PREG, ED: Preg Test, Ur: NEGATIVE

## 2021-12-16 MED ORDER — PREDNISONE 20 MG PO TABS: 40.0000 mg | ORAL_TABLET | Freq: Every day | ORAL | 0 refills | Status: DC

## 2021-12-16 MED ORDER — KETOROLAC TROMETHAMINE 30 MG/ML IJ SOLN
INTRAMUSCULAR | Status: AC
  Filled 2021-12-16: qty 1

## 2021-12-16 MED ORDER — KETOROLAC TROMETHAMINE 30 MG/ML IJ SOLN
30.0000 mg | Freq: Once | INTRAMUSCULAR | Status: AC
  Administered 2021-12-16: 30 mg via INTRAMUSCULAR

## 2021-12-16 MED ORDER — BACLOFEN 10 MG PO TABS: 10.0000 mg | ORAL_TABLET | Freq: Two times a day (BID) | ORAL | 0 refills | Status: DC | PRN

## 2021-12-16 NOTE — Discharge Instructions (Addendum)
Your blood pressure was elevated.  I believe this is because you are in pain.  Please monitor your blood pressure at home and if this is persistently above 140/90 you need to return here or see your PCP.  Avoid NSAIDs including aspirin, ibuprofen/Advil, naproxen/Aleve.  Avoid sodium, caffeine, decongestants.  If you develop any chest pain, shortness of breath, headache, vision changes in the setting of high blood pressure you need to go to the emergency room. ? ?Your x-ray was normal which showed no bony abnormalities.  I believe that you have a muscle strain.  Start prednisone burst (40 mg for 4 days).  You should not take NSAIDs including aspirin, ibuprofen/Advil, naproxen/Aleve with this medication as it will cause stomach bleeding.  You can take baclofen up to twice a day.  This will make you sleepy so do not drive or drink alcohol taking it.  Continue with heat and gentle stretch.  If anything worsens you need to be seen immediately including high fever, worsening pain, blood in your urine, abdominal pain, nausea, vomiting.  If symptoms are not improving please follow-up with sports medicine for further evaluation and management. ?

## 2021-12-16 NOTE — ED Provider Notes (Signed)
?[facility] ? ? ? ?CSN: [id number] ?Arrival date & time: 12/16/21  0907 ? ? ?  ? ?History   ?Chief Complaint ?Chief Complaint  ?Patient presents with  ? Back Pain  ? ? ?HPI ?Jordan Mcbride is a 51 y.o. female.  ? ?Patient presents today with a 1 week history of right-sided thoracic back/right flank pain.  Reports that pain is rated 10 on a 0-10 pain scale, worse with prolonged sitting or standing, worse with moving positions, no alleviating factors identified.  Pain is described as intense aching with periodic shooting pains.  She is having difficulty with daily activities as result of symptoms that she can only find comfort with lying flat with a heating pad.  She has tried Tylenol and ibuprofen without improvement of symptoms.  She denies any known injury or increase in activity prior to symptom onset.  She does not believe that she is pregnant but is interested in pregnancy test.  She denies any history of diabetes.  She denies any associated bowel/bladder incontinence, lower extremity weakness, saddle anesthesia.  Denies any prodromal symptoms or recent rash.  She denies any history of nephrolithiasis or episodes of similar symptoms in the past. ? ? ?Past Medical History:  ?Diagnosis Date  ? 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 Ovarian cyst   ? Umbilical hernia 1/82/9937  ? ? ?Patient Active Problem List  ? Diagnosis Date Noted  ? 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 Hyperprolactinemia ([place]) 07/14/2017  ? Morbid obesity ([place]) 07/14/2017  ? Umbilical hernia 16/96/7893  ? Internal hemorrhoids 02/19/2012  ? ? ?Past Surgical History:  ?Procedure Laterality Date  ? EYE SURGERY    ? removed eyelid cyst  ? INSERTION OF MESH N/A 02/18/2017  ? Procedure: INSERTION OF MESH;  Surgeon: Fanny Skates, MD;  Location: [facility];  Service: General;  Laterality: N/A;  ? UMBILICAL HERNIA REPAIR N/A 02/18/2017  ? Procedure: REPAIR UMBILICAL HERNIA WITH MESH;  Surgeon: Fanny Skates, MD;   Location: [facility];  Service: General;  Laterality: N/A;  ? ? ?OB History   ? ? Gravida  ?5  ? Para  ?3  ? Term  ?3  ? Preterm  ?   ? AB  ?2  ? Living  ?3  ?  ? ? SAB  ?2  ? IAB  ?   ? Ectopic  ?   ? Multiple  ?   ? Live Births  ?3  ?   ?  ?  ? ? ? ?Home Medications   ? ?Prior to Admission medications   ?Medication Sig Start Date End Date Taking? Authorizing Provider  ?baclofen (LIORESAL) 10 MG tablet Take 1 tablet (10 mg total) by mouth 2 (two) times daily as needed for muscle spasms. 12/16/21  Yes Ksean Vale K, PA-C  ?predniSONE (DELTASONE) 20 MG tablet Take 2 tablets (40 mg total) by mouth daily. 12/16/21  Yes Asianna Brundage, Derry Skill, PA-C  ?levocetirizine (XYZAL) 5 MG tablet Take 1 tablet (5 mg total) by mouth at bedtime. ?Patient not taking: Reported on 12/16/2021 11/08/21   Scot Jun, FNP  ?lidocaine (XYLOCAINE) 2 % solution Use as directed 15 mLs in the mouth or throat every 3 (three) hours as needed for mouth pain (mix with 4 ounces of water, gargle and spit). ?Patient not taking: Reported on 12/16/2021 11/08/21   Scot Jun, FNP  ? ? ?Family History ?Family History  ?Problem Relation Age of Onset  ?  Healthy Mother   ? ? ?Social History ?Social History  ? ?Tobacco Use  ? Smoking status: Never  ? Smokeless tobacco: Never  ?Vaping Use  ? Vaping Use: Never used  ?Substance Use Topics  ? Alcohol use: Not Currently  ?  Comment: socially  ? Drug use: No  ? ? ? ?Allergies   ?Patient has no known allergies. ? ? ?Review of Systems ?Review of Systems  ?Constitutional:  Positive for activity change. Negative for appetite change, fatigue and fever.  ?Respiratory:  Negative for cough and shortness of breath.   ?Cardiovascular:  Negative for chest pain.  ?Gastrointestinal:  Negative for abdominal pain, diarrhea, nausea and vomiting.  ?Genitourinary:  Positive for flank pain. Negative for dysuria, frequency, hematuria, pelvic pain, urgency, vaginal bleeding, vaginal discharge and vaginal pain.   ?Musculoskeletal:  Positive for back pain. Negative for arthralgias and myalgias.  ?Neurological:  Negative for dizziness, weakness, light-headedness, numbness and headaches.  ? ? ?Physical Exam ?Triage Vital Signs ?ED Triage Vitals  ?Enc Vitals Group  ?   BP 12/16/21 0940 (!) 159/92  ?   Pulse Rate 12/16/21 0940 75  ?   Resp 12/16/21 0940 (!) 22  ?   Temp 12/16/21 0940 98.3 ?F (36.8 ?C)  ?   Temp Source 12/16/21 0940 Oral  ?   SpO2 12/16/21 0940 98 %  ?   Weight --   ?   Height --   ?   Head Circumference --   ?   Peak Flow --   ?   Pain Score 12/16/21 0937 10  ?   Pain Loc --   ?   Pain Edu? --   ?   Excl. in [place]? --   ? ?No data found. ? ?Updated Vital Signs ?BP (!) 159/92 (BP Location: Left Arm) Comment (BP Location): regular cuff, forearm  Pulse 75   Temp 98.3 ?F (36.8 ?C) (Oral)   Resp (!) 22   LMP 11/10/2021   SpO2 98%  ? ?Visual Acuity ?Right Eye Distance:   ?Left Eye Distance:   ?Bilateral Distance:   ? ?Right Eye Near:   ?Left Eye Near:    ?Bilateral Near:    ? ?Physical Exam ?Vitals reviewed.  ?Constitutional:   ?   General: She is awake. She is not in acute distress. ?   Appearance: Normal appearance. She is well-developed. She is not ill-appearing.  ?   Comments: Very pleasant female appears stated age in no acute distress sitting comfortably in exam room  ?HENT:  ?   Head: Normocephalic and atraumatic.  ?Cardiovascular:  ?   Rate and Rhythm: Normal rate and regular rhythm.  ?   Heart sounds: Normal heart sounds, S1 normal and S2 normal. No murmur heard. ?Pulmonary:  ?   Effort: Pulmonary effort is normal.  ?   Breath sounds: Normal breath sounds. No wheezing, rhonchi or rales.  ?   Comments: Clear to auscultation bilaterally ?Abdominal:  ?   General: Bowel sounds are normal.  ?   Palpations: Abdomen is soft.  ?   Tenderness: There is no abdominal tenderness. There is right CVA tenderness. There is no left CVA tenderness, guarding or rebound.  ?Musculoskeletal:  ?   Cervical back: No tenderness or  bony tenderness.  ?   Thoracic back: Tenderness present. No spasms or bony tenderness.  ?   Lumbar back: No tenderness or bony tenderness.  ?   Comments: Significant tenderness palpation over right thoracic paraspinal muscles.  No pain on percussion of vertebrae.  Decreased range of motion with forward flexion, extension, rotation secondary to pain.  ?Skin: ?   Findings: No rash.  ?   Comments: No rash on exam  ?Psychiatric:     ?   Behavior: Behavior is cooperative.  ? ? ? ?UC Treatments / Results  ?Labs ?(all labs ordered are listed, but only abnormal results are displayed) ?Labs Reviewed  ?POCT URINALYSIS DIPSTICK, ED / UC - Abnormal; Notable for the following components:  ?    Result Value  ? Protein, ur 30 (*)   ? All other components within normal limits  ?POC URINE PREG, ED  ? ? ?EKG ? ? ?Radiology ?DG Thoracic Spine 2 View ? ?Result Date: 12/16/2021 ?CLINICAL DATA:  One week history of back pain/ right flank . Movement makes pain worse. The process of standing makes pain increase as well thoracic spine EXAM: THORACIC SPINE 2 VIEWS COMPARISON:  Thoracic spine radiographs 01/20/2013 FINDINGS: Mild S shaped curvature. Views of the upper thoracic vertebral body limited by overlapping soft tissues on lateral view. There is no evidence of thoracic spine fracture. No listhesis. No other significant bone abnormalities are identified. IMPRESSION: No acute finding. Electronically Signed   By: Audie Pinto M.D.   On: 12/16/2021 10:33   ? ?Procedures ?Procedures (including critical care time) ? ?Medications Ordered in UC ?Medications  ?ketorolac (TORADOL) 30 MG/ML injection 30 mg (has no administration in time range)  ? ? ?Initial Impression / Assessment and Plan / UC Course  ?I have reviewed the triage vital signs and the nursing notes. ? ?Pertinent labs & imaging results that were available during my care of the patient were reviewed by me and considered in my medical decision making (see chart for details). ? ?   ? ?Patient was noted to have elevated blood pressure today.  She denies any chest pain, shortness of breath, headache, vision changes, dizziness.  She attributes this to increased pain she is experiencing.  She has bee

## 2021-12-16 NOTE — ED Triage Notes (Signed)
One week history of back pain/ right flank .  Movement makes pain worse. The process of standing makes pain increase as well ?

## 2022-01-14 ENCOUNTER — Other Ambulatory Visit: Payer: Self-pay

## 2022-01-14 ENCOUNTER — Emergency Department (HOSPITAL_COMMUNITY)
Admission: EM | Admit: 2022-01-14 | Discharge: 2022-01-15 | Disposition: A | Discharge status: Home / Self Care | Payer: Medicaid Other | Attending: Emergency Medicine | Admitting: Emergency Medicine

## 2022-01-14 ENCOUNTER — Encounter (HOSPITAL_COMMUNITY): Payer: Self-pay

## 2022-01-14 DIAGNOSIS — I10 Essential (primary) hypertension: Secondary | ICD-10-CM | POA: Insufficient documentation

## 2022-01-14 DIAGNOSIS — H9201 Otalgia, right ear: Secondary | ICD-10-CM | POA: Insufficient documentation

## 2022-01-14 DIAGNOSIS — R42 Dizziness and giddiness: Secondary | ICD-10-CM | POA: Diagnosis present

## 2022-01-14 LAB — URINALYSIS, ROUTINE W REFLEX MICROSCOPIC
Bilirubin Urine: NEGATIVE
Glucose, UA: NEGATIVE mg/dL
Hgb urine dipstick: NEGATIVE
Ketones, ur: NEGATIVE mg/dL
Leukocytes,Ua: NEGATIVE
Nitrite: NEGATIVE
Protein, ur: NEGATIVE mg/dL
Specific Gravity, Urine: 1.018 (ref 1.005–1.030)
pH: 6 (ref 5.0–8.0)

## 2022-01-14 LAB — CBC
HCT: 35.2 % — ABNORMAL LOW (ref 36.0–46.0)
Hemoglobin: 11.2 g/dL — ABNORMAL LOW (ref 12.0–15.0)
MCH: 28.1 pg (ref 26.0–34.0)
MCHC: 31.8 g/dL (ref 30.0–36.0)
MCV: 88.4 fL (ref 80.0–100.0)
Platelets: 203 10*3/uL (ref 150–400)
RBC: 3.98 MIL/uL (ref 3.87–5.11)
RDW: 14 % (ref 11.5–15.5)
WBC: 4.5 10*3/uL (ref 4.0–10.5)
nRBC: 0 % (ref 0.0–0.2)

## 2022-01-14 NOTE — ED Triage Notes (Signed)
Patient said she has been feeling dizzy, the room has been spinning and she has been seeing stars. Has not passed out. This began today. ?

## 2022-01-15 ENCOUNTER — Emergency Department (HOSPITAL_COMMUNITY): Payer: Medicaid Other

## 2022-01-15 LAB — BASIC METABOLIC PANEL
Anion gap: 6 (ref 5–15)
BUN: 12 mg/dL (ref 6–20)
CO2: 27 mmol/L (ref 22–32)
Calcium: 8.8 mg/dL — ABNORMAL LOW (ref 8.9–10.3)
Chloride: 108 mmol/L (ref 98–111)
Creatinine, Ser: 0.92 mg/dL (ref 0.44–1.00)
GFR, Estimated: >60 mL/min (ref >60)
Glucose, Bld: 100 mg/dL — ABNORMAL HIGH (ref 70–99)
Potassium: 4.3 mmol/L (ref 3.5–5.1)
Sodium: 141 mmol/L (ref 135–145)

## 2022-01-15 MED ORDER — MECLIZINE HCL 25 MG PO TABS
25.0000 mg | ORAL_TABLET | Freq: Three times a day (TID) | ORAL | 0 refills | Status: DC | PRN
Start: 2022-01-15 — End: 2023-09-08

## 2022-01-15 MED ORDER — LORAZEPAM 2 MG/ML IJ SOLN
0.5000 mg | Freq: Once | INTRAMUSCULAR | Status: AC
  Administered 2022-01-15: 0.5 mg via INTRAVENOUS
  Filled 2022-01-15: qty 1

## 2022-01-15 MED ORDER — MECLIZINE HCL 25 MG PO TABS
25.0000 mg | ORAL_TABLET | Freq: Once | ORAL | Status: AC
  Administered 2022-01-15: 25 mg via ORAL
  Filled 2022-01-15: qty 1

## 2022-01-15 MED ORDER — LACTATED RINGERS IV BOLUS
1000.0000 mL | Freq: Once | INTRAVENOUS | Status: AC
  Administered 2022-01-15: 1000 mL via INTRAVENOUS

## 2022-01-15 MED ORDER — DEXAMETHASONE SODIUM PHOSPHATE 10 MG/ML IJ SOLN
10.0000 mg | Freq: Once | INTRAMUSCULAR | Status: AC
  Administered 2022-01-15: 10 mg via INTRAVENOUS
  Filled 2022-01-15: qty 1

## 2022-01-15 MED ORDER — PREDNISONE 20 MG PO TABS: ORAL_TABLET | ORAL | 0 refills | Status: DC

## 2022-01-15 NOTE — ED Provider Notes (Signed)
?[facility] DEPT ?Provider Note ? ? ?CSN: [id number] ?Arrival date & time: 01/14/22  2310 ? ?  ? ?History ? ?Chief Complaint  ?Patient presents with  ? Dizziness  ? ? ?Jordan Mcbride is a 51 y.o. female. ? ?50 year old female without significant past medical history who presents to the emergency department today secondary to vertigo.  She states that she woke up with the feeling of the room spinning.  She states that when she stands up it significantly worse but when she rests it is not really present.  She states she is never anything at this before.  She has no headache, ear pain, throat pain, recent trauma, recent infection, weakness, numbness, tingling or vision changes.  No recent illnesses. ? ? ?Dizziness ? ?  ? ?Home Medications ?Prior to Admission medications   ?Medication Sig Start Date End Date Taking? Authorizing Provider  ?meclizine (ANTIVERT) 25 MG tablet Take 1 tablet (25 mg total) by mouth 3 (three) times daily as needed for dizziness. 01/15/22  Yes Legaci Tarman, Corene Cornea, MD  ?predniSONE (DELTASONE) 20 MG tablet 3 tabs po daily x 3 days, then 2 tabs x 3 days, then 1.5 tabs x 3 days, then 1 tab x 3 days, then 0.5 tabs x 3 days 01/15/22  Yes Hodaya Curto, Corene Cornea, MD  ?baclofen (LIORESAL) 10 MG tablet Take 1 tablet (10 mg total) by mouth 2 (two) times daily as needed for muscle spasms. 12/16/21   Raspet, Derry Skill, PA-C  ?   ? ?Allergies    ?Patient has no known allergies.   ? ?Review of Systems   ?Review of Systems  ?Neurological:  Positive for dizziness.  ? ?Physical Exam ?Updated Vital Signs ?BP (!) 161/97   Pulse 65   Temp 98.2 ?F (36.8 ?C) (Oral)   Resp (!) 22   Ht '5\' 5"'$  (1.651 m)   Wt 114 kg   SpO2 97%   BMI 41.82 kg/m?  ?Physical Exam ?Vitals and nursing note reviewed.  ?Constitutional:   ?   Appearance: She is well-developed.  ?HENT:  ?   Head: Normocephalic and atraumatic.  ?   Comments: Right ear with an erythematous tympanic membrane and effusion present.  ?   Nose: No  congestion or rhinorrhea.  ?   Mouth/Throat:  ?   Mouth: Mucous membranes are moist.  ?   Pharynx: Oropharynx is clear.  ?Eyes:  ?   Pupils: Pupils are equal, round, and reactive to light.  ?Cardiovascular:  ?   Rate and Rhythm: Normal rate and regular rhythm.  ?Pulmonary:  ?   Effort: No respiratory distress.  ?   Breath sounds: No stridor.  ?Abdominal:  ?   General: Abdomen is flat. There is no distension.  ?Musculoskeletal:     ?   General: No swelling or tenderness. Normal range of motion.  ?   Cervical back: Normal range of motion.  ?Skin: ?   General: Skin is warm and dry.  ?Neurological:  ?   General: No focal deficit present.  ?   Mental Status: She is alert.  ? ? ?ED Results / Procedures / Treatments   ?Labs ?(all labs ordered are listed, but only abnormal results are displayed) ?Labs Reviewed  ?BASIC METABOLIC PANEL - Abnormal; Notable for the following components:  ?    Result Value  ? Glucose, Bld 100 (*)   ? Calcium 8.8 (*)   ? All other components within normal limits  ?CBC - Abnormal; Notable for the  following components:  ? Hemoglobin 11.2 (*)   ? HCT 35.2 (*)   ? All other components within normal limits  ?URINALYSIS, ROUTINE W REFLEX MICROSCOPIC  ? ? ?EKG ?EKG Interpretation ? ?Date/Time:  Tuesday January 14 2022 23:22:26 EDT ?Ventricular Rate:  71 ?PR Interval:  179 ?QRS Duration: 98 ?QT Interval:  400 ?QTC Calculation: 435 ?R Axis:   0 ?Text Interpretation: Sinus rhythm No significant change since last tracing Confirmed by Dorie Rank [phone]) on 01/14/2022 11:25:25 PM ? ?Radiology ?CT Head Wo Contrast ? ?Result Date: 01/15/2022 ?CLINICAL DATA:  Headache.  History of pituitary adenoma. EXAM: CT HEAD WITHOUT CONTRAST TECHNIQUE: Contiguous axial images were obtained from the base of the skull through the vertex without intravenous contrast. RADIATION DOSE REDUCTION: This exam was performed according to the departmental dose-optimization program which includes automated exposure control, adjustment of the  mA and/or kV according to patient size and/or use of iterative reconstruction technique. COMPARISON:  Brain MRI dated 01/12/2020. FINDINGS: Brain: The ventricles and sulci appropriate size for patient's age. The gray-white matter discrimination is preserved. There is no acute intracranial hemorrhage. No mass effect or midline shift no extra-axial fluid collection. Known pituitary adenoma is not evaluated on this CT. Vascular: No hyperdense vessel or unexpected calcification. Skull: Normal. Negative for fracture or focal lesion. Sinuses/Orbits: No acute finding. Other: None IMPRESSION: No acute intracranial pathology. Electronically Signed   By: Anner Crete M.D.   On: 01/15/2022 01:29   ? ?Procedures ?Procedures  ? ? ?Medications Ordered in ED ?Medications  ?lactated ringers bolus 1,000 mL (0 mLs Intravenous Stopped 01/15/22 0217)  ?meclizine (ANTIVERT) tablet 25 mg (25 mg Oral Given 01/15/22 0045)  ?dexamethasone (DECADRON) injection 10 mg (10 mg Intravenous Given 01/15/22 0226)  ?LORazepam (ATIVAN) injection 0.5 mg (0.5 mg Intravenous Given 01/15/22 0227)  ? ? ?ED Course/ Medical Decision Making/ A&P ?  ?                        ?Medical Decision Making ?Amount and/or Complexity of Data Reviewed ?Labs: ordered. ?Radiology: ordered. ? ?Risk ?Prescription drug management. ? ?Vertigo. Possibly vestibular neuritis as she does have the right ear abnormalities. Does have elevated BP, will ct head.  ?Considered posterior stroke and possible need for MRI however symptoms were consistent with movement and on review of the records it appears she almost always has hypertension. Her symptoms almost totally resolved and I felt it was more peripheral in nature at that time. Shared decision making and patient felt ok being discharged. ? ?Final Clinical Impression(s) / ED Diagnoses ?Final diagnoses:  ?Vertigo  ?Hypertension, unspecified type  ? ? ?Rx / DC Orders ?ED Discharge Orders   ? ?      Ordered  ?  predniSONE (DELTASONE) 20  MG tablet       ? 01/15/22 0316  ?  meclizine (ANTIVERT) 25 MG tablet  3 times daily PRN       ? 01/15/22 0316  ? ?  ?  ? ?  ? ? ?  ?Merrily Pew, MD ?01/15/22 [phone] ? ?

## 2022-01-15 NOTE — ED Notes (Signed)
Ambulated to restroom with steady gait, states she feels much better ?

## 2022-03-10 ENCOUNTER — Ambulatory Visit (INDEPENDENT_AMBULATORY_CARE_PROVIDER_SITE_OTHER): Payer: Medicaid Other

## 2022-03-10 ENCOUNTER — Encounter (HOSPITAL_COMMUNITY): Payer: Self-pay | Admitting: Emergency Medicine

## 2022-03-10 ENCOUNTER — Ambulatory Visit (HOSPITAL_COMMUNITY)
Admission: EM | Admit: 2022-03-10 | Discharge: 2022-03-10 | Disposition: A | Discharge status: Home / Self Care | Payer: Medicaid Other | Attending: Family Medicine | Admitting: Family Medicine

## 2022-03-10 DIAGNOSIS — M545 Low back pain, unspecified: Secondary | ICD-10-CM | POA: Diagnosis not present

## 2022-03-10 DIAGNOSIS — M79641 Pain in right hand: Secondary | ICD-10-CM | POA: Diagnosis not present

## 2022-03-10 DIAGNOSIS — N910 Primary amenorrhea: Secondary | ICD-10-CM

## 2022-03-10 LAB — POC URINE PREG, ED: Preg Test, Ur: NEGATIVE

## 2022-03-10 MED ORDER — KETOROLAC TROMETHAMINE 30 MG/ML IJ SOLN
30.0000 mg | Freq: Once | INTRAMUSCULAR | Status: AC
  Administered 2022-03-10: 30 mg via INTRAMUSCULAR

## 2022-03-10 MED ORDER — IBUPROFEN 800 MG PO TABS: 800.0000 mg | ORAL_TABLET | Freq: Three times a day (TID) | ORAL | 0 refills | Status: DC | PRN

## 2022-03-10 MED ORDER — KETOROLAC TROMETHAMINE 30 MG/ML IJ SOLN
INTRAMUSCULAR | Status: AC
  Filled 2022-03-10: qty 1

## 2022-03-10 NOTE — Discharge Instructions (Addendum)
Your x-rays of your hand did not show any bony problem  The x-rays of your back did show some mild degenerative changes.  You have been given a shot of Toradol 30 mg today.  Take ibuprofen 800 mg--1 tab every 8 hours as needed for pain.  Pregnancy test negative.

## 2022-03-10 NOTE — ED Triage Notes (Signed)
Pt is present today with right hip and hand pain. Pt states her pain started x3 days ago. Pt denies any injury

## 2022-03-10 NOTE — ED Provider Notes (Signed)
Is a [facility]    CSN: [id number] Arrival date & time: 03/10/22  1040      History   Chief Complaint Chief Complaint  Patient presents with   Hip Pain   Hand Pain    HPI Jordan Mcbride is a 51 y.o. female.    Hip Pain  Hand Pain   Here for a 3-day history of right low back pain and right hand pain.  No fever or URI symptoms noted.  No blood in the urine and no dysuria.  Last menstrual period was over a month ago   No vomiting. 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   Hyperprolactinemia ([place]) 07/14/2017   Morbid obesity ([place]) 85/63/1497   Umbilical hernia 02/63/7858   Internal hemorrhoids 02/19/2012    Past Surgical History:  Procedure Laterality Date   EYE SURGERY     removed eyelid cyst   INSERTION OF MESH N/A 02/18/2017   Procedure: INSERTION OF MESH;  Surgeon: Fanny Skates, MD;  Location: [facility];  Service: General;  Laterality: N/A;   UMBILICAL HERNIA REPAIR N/A 02/18/2017   Procedure: REPAIR UMBILICAL HERNIA WITH MESH;  Surgeon: Fanny Skates, MD;  Location: [facility];  Service: General;  Laterality: N/A;    OB History     Gravida  5   Para  3   Term  3   Preterm      AB  2   Living  3      SAB  2   IAB      Ectopic      Multiple      Live Births  3            Home Medications    Prior to Admission medications   Medication Sig Start Date End Date Taking? Authorizing Provider  ibuprofen (ADVIL) 800 MG tablet Take 1 tablet (800 mg total) by mouth every 8 (eight) hours as needed (pain). 03/10/22  Yes Barrett Henle, MD  meclizine (ANTIVERT) 25 MG tablet Take 1 tablet (25 mg total) by mouth 3 (three) times daily as needed for dizziness. 01/15/22   Mesner, Corene Cornea, MD    Family History Family  History  Problem Relation Age of Onset   Healthy Mother     Social History Social History   Tobacco Use   Smoking status: Never   Smokeless tobacco: Never  Vaping Use   Vaping Use: Never used  Substance Use Topics   Alcohol use: Not Currently    Comment: socially   Drug use: No     Allergies   Patient has no known allergies.   Review of Systems Review of Systems   Physical Exam Triage Vital Signs ED Triage Vitals  Enc Vitals Group     BP 03/10/22 1228 115/80     Pulse Rate 03/10/22 1227 68     Resp 03/10/22 1227 18     Temp 03/10/22 1227 98.6 F (37 C)     Temp src --      SpO2 03/10/22 1227 97 %     Weight --      Height --      Head Circumference --      Peak Flow --  Pain Score 03/10/22 1229 10     Pain Loc --      Pain Edu? --      Excl. in [place]? --    No data found.  Updated Vital Signs BP 115/80   Pulse 68   Temp 98.6 F (37 C)   Resp 18   SpO2 97%   Visual Acuity Right Eye Distance:   Left Eye Distance:   Bilateral Distance:    Right Eye Near:   Left Eye Near:    Bilateral Near:     Physical Exam Vitals reviewed.  Constitutional:      General: She is not in acute distress.    Appearance: She is not ill-appearing, toxic-appearing or diaphoretic.  Eyes:     Extraocular Movements: Extraocular movements intact.     Pupils: Pupils are equal, round, and reactive to light.  Cardiovascular:     Rate and Rhythm: Normal rate and regular rhythm.  Pulmonary:     Effort: Pulmonary effort is normal.     Breath sounds: Normal breath sounds.  Musculoskeletal:     Comments: There is some mild tenderness of the right lower lumbar area.  Also her right hand is tender and she has a hard time closing it in a fist.  No swelling or deformity  Skin:    Coloration: Skin is not jaundiced or pale.     Findings: No rash.  Neurological:     Mental Status: She is alert.      UC Treatments / Results  Labs (all labs ordered are listed, but only  abnormal results are displayed) Labs Reviewed  POC URINE PREG, ED    EKG   Radiology DG Lumbar Spine 2-3 Views  Result Date: 03/10/2022 CLINICAL DATA:  Right lower back pain EXAM: LUMBAR SPINE - 2-3 VIEW COMPARISON:  None FINDINGS: There is a transitional lumbosacral vertebrae, partially lumbarized S1 with rudimentary disc at S1-S2. There is mild degenerative disc disease at L5-S1. There is mild to moderate lower lumbar predominant facet arthropathy. There is no evidence of lumbar spine fracture. 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 Mild degenerative disc disease at L5-S1. Mild to moderate lower lumbar predominant facet arthropathy. No evidence of lumbar spine fracture. Electronically Signed   By: Maurine Simmering M.D.   On: 03/10/2022 13:54   DG Hand Complete Right  Result Date: 03/10/2022 CLINICAL DATA:  Right hand pain EXAM: RIGHT HAND - COMPLETE 3 VIEW COMPARISON:  None Available. FINDINGS: There is no evidence of fracture or dislocation. There is no evidence of arthropathy or other focal bone abnormality. Soft tissues are unremarkable. IMPRESSION: Negative. Electronically Signed   By: Yetta Glassman M.D.   On: 03/10/2022 13:53    Procedures Procedures (including critical care time)  Medications Ordered in UC Medications  ketorolac (TORADOL) 30 MG/ML injection 30 mg (has no administration in time range)    Initial Impression / Assessment and Plan / UC Course  I have reviewed the triage vital signs and the nursing notes.  Pertinent labs & imaging results that were available during my care of the patient were reviewed by me and considered in my medical decision making (see chart for details).    UPT is negative.  X-rays of the hand are negative.  X-rays of the L-spine show some degenerative changes.  We will treat with some ibuprofen and Toradol.  And also will give her a note for work to let her rest a couple  of days.   Assistance requested to help her find a PCP Final Clinical Impressions(s) / UC Diagnoses   Final diagnoses:  Delayed menses  Acute right-sided low back pain without sciatica  Right hand pain     Discharge Instructions      Your x-rays of your hand did not show any bony problem  The x-rays of your back did show some mild degenerative changes.  You have been given a shot of Toradol 30 mg today.  Take ibuprofen 800 mg--1 tab every 8 hours as needed for pain.  Pregnancy test negative.      ED Prescriptions     Medication Sig Dispense Auth. Provider   ibuprofen (ADVIL) 800 MG tablet Take 1 tablet (800 mg total) by mouth every 8 (eight) hours as needed (pain). 21 tablet Noell Shular, Gwenlyn Perking, MD      PDMP not reviewed this encounter.   Barrett Henle, MD 03/10/22 [phone]

## 2022-03-13 ENCOUNTER — Encounter (HOSPITAL_COMMUNITY): Payer: Self-pay

## 2022-04-11 IMAGING — DX DG ANKLE COMPLETE 3+V*L*
3 series · 3 of 3 positions shown · non-contrast
Comparison: None.

CLINICAL DATA: 49-year-old female with fall and trauma to the left
ankle.

EXAM:
LEFT ANKLE COMPLETE - 3+ VIEW

[ankle ap]
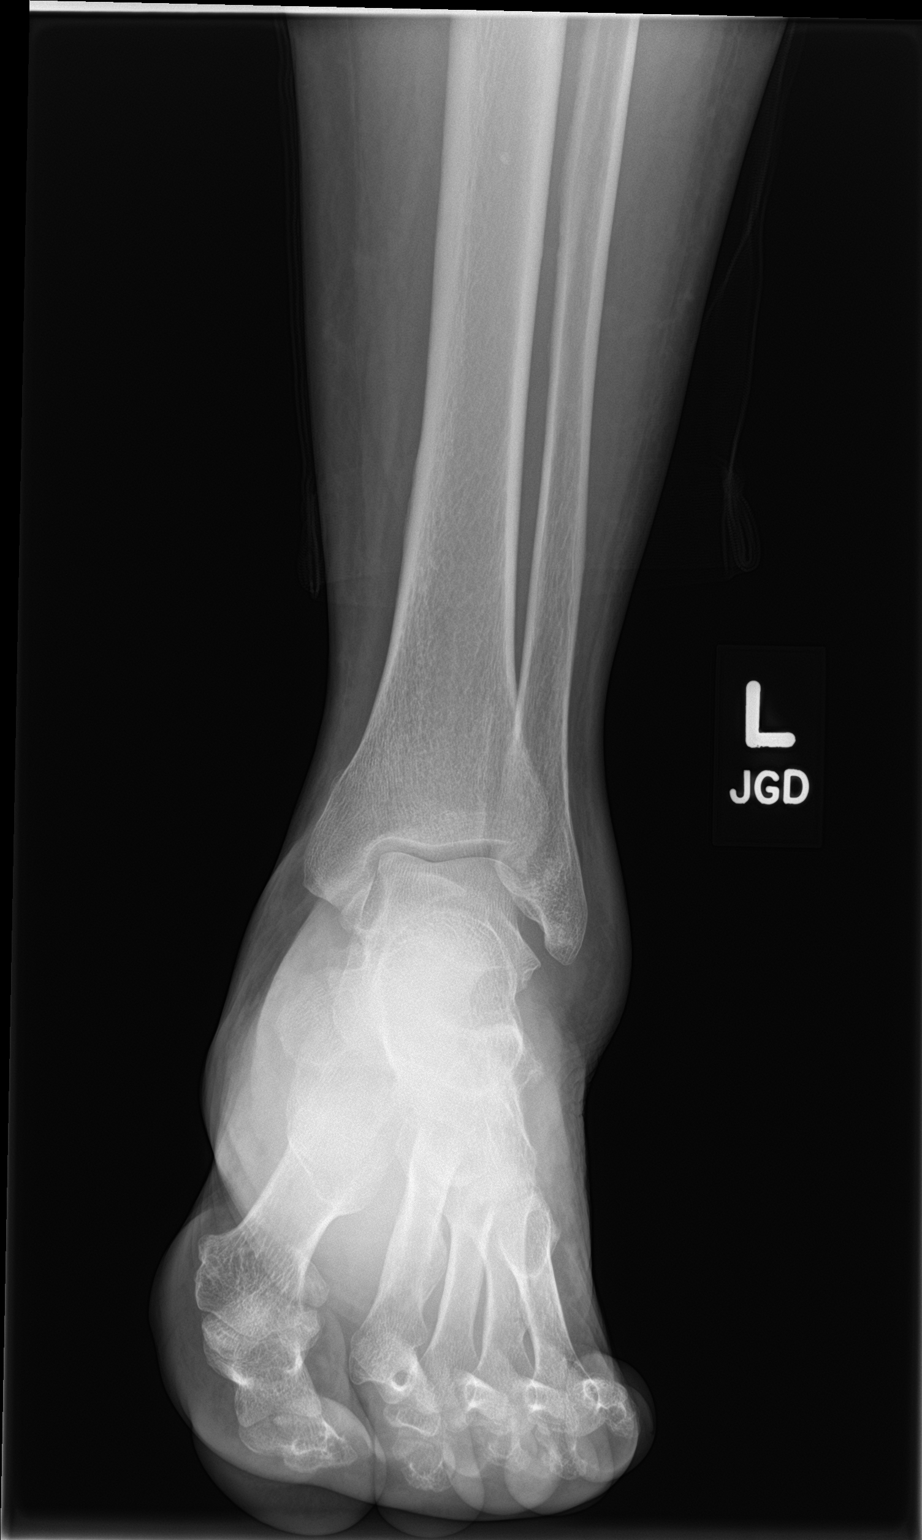

[ankle obl]
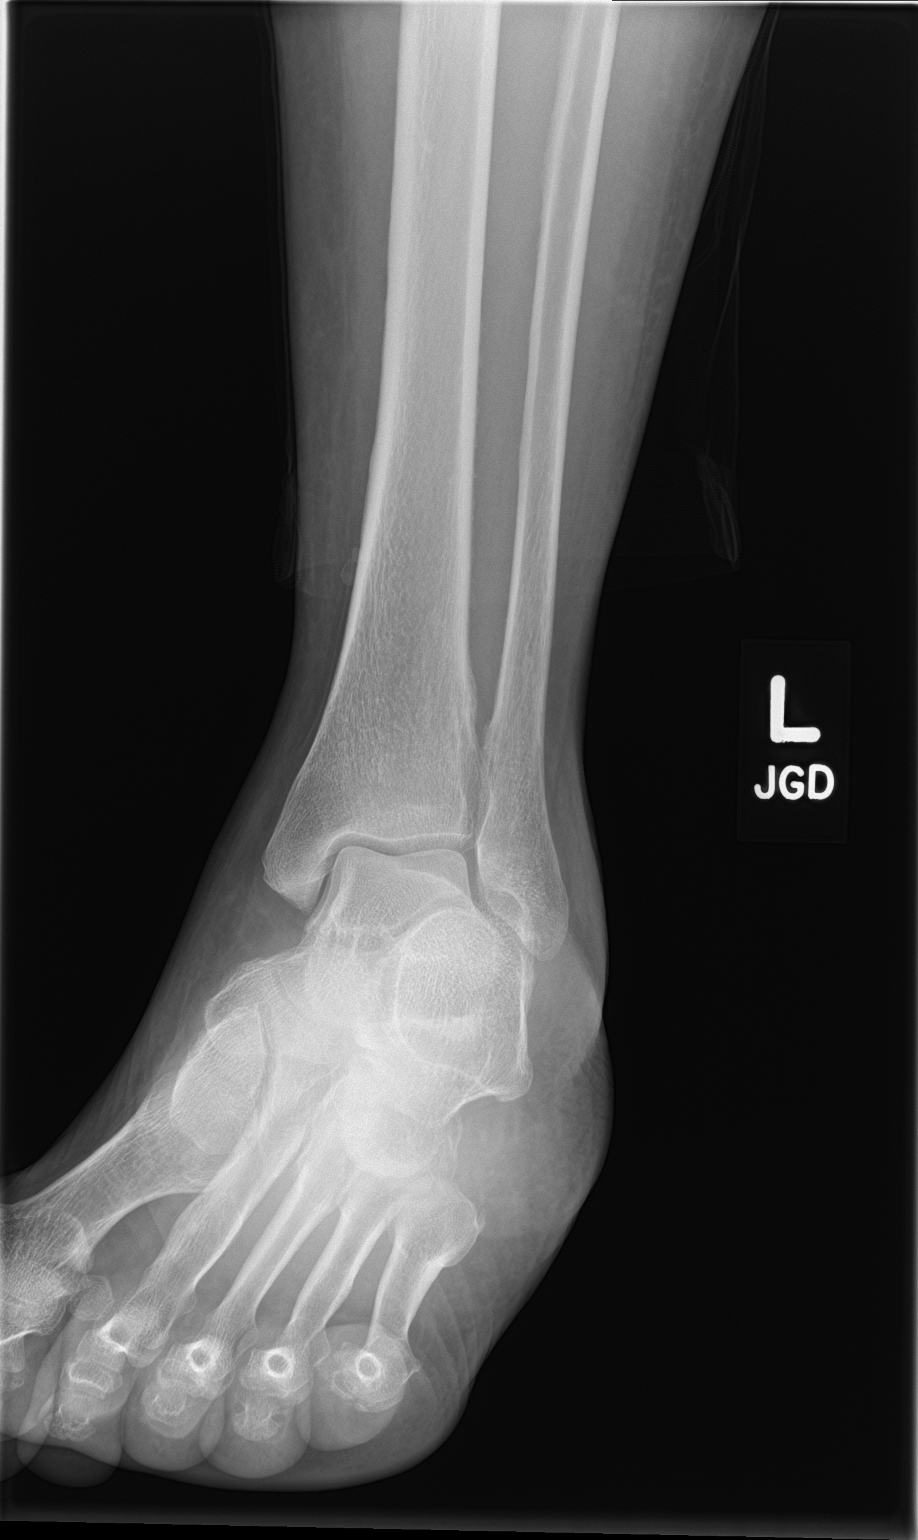

[ankle lat]
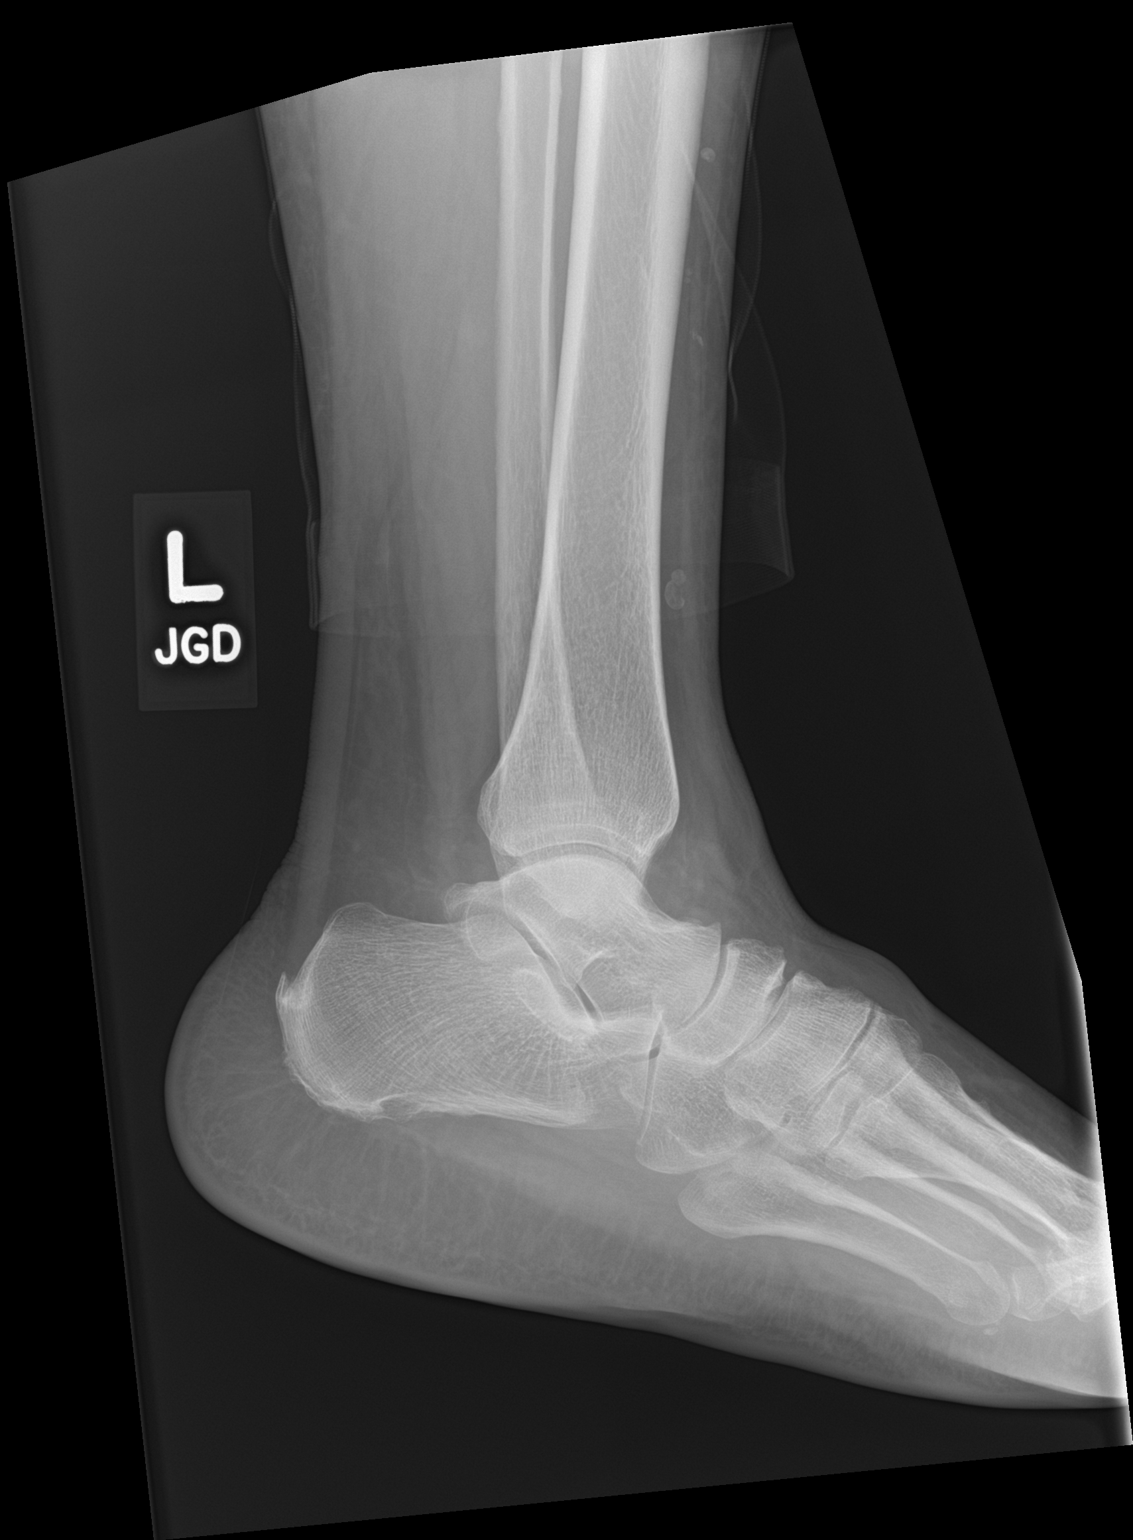

[3 of 3 positions shown; findings below may reference images not displayed]

FINDINGS: There is no evidence of fracture, dislocation, or joint effusion.
There is no evidence of arthropathy or other focal bone abnormality.
Soft tissues are unremarkable.
IMPRESSION: Negative.

## 2022-06-05 ENCOUNTER — Encounter (HOSPITAL_COMMUNITY): Payer: Self-pay | Admitting: *Deleted

## 2022-06-05 ENCOUNTER — Ambulatory Visit (HOSPITAL_COMMUNITY)
Admission: EM | Admit: 2022-06-05 | Discharge: 2022-06-05 | Disposition: A | Discharge status: Home / Self Care | Payer: Medicaid Other | Attending: Physician Assistant | Admitting: Physician Assistant

## 2022-06-05 ENCOUNTER — Ambulatory Visit (INDEPENDENT_AMBULATORY_CARE_PROVIDER_SITE_OTHER): Payer: Medicaid Other

## 2022-06-05 DIAGNOSIS — I10 Essential (primary) hypertension: Secondary | ICD-10-CM

## 2022-06-05 DIAGNOSIS — R062 Wheezing: Secondary | ICD-10-CM

## 2022-06-05 DIAGNOSIS — M549 Dorsalgia, unspecified: Secondary | ICD-10-CM | POA: Diagnosis not present

## 2022-06-05 DIAGNOSIS — R0989 Other specified symptoms and signs involving the circulatory and respiratory systems: Secondary | ICD-10-CM

## 2022-06-05 DIAGNOSIS — J40 Bronchitis, not specified as acute or chronic: Secondary | ICD-10-CM

## 2022-06-05 DIAGNOSIS — R0602 Shortness of breath: Secondary | ICD-10-CM

## 2022-06-05 DIAGNOSIS — J069 Acute upper respiratory infection, unspecified: Secondary | ICD-10-CM | POA: Diagnosis not present

## 2022-06-05 DIAGNOSIS — R059 Cough, unspecified: Secondary | ICD-10-CM

## 2022-06-05 LAB — POCT URINALYSIS DIPSTICK, ED / UC
Bilirubin Urine: NEGATIVE
Glucose, UA: NEGATIVE mg/dL
Hgb urine dipstick: NEGATIVE
Ketones, ur: NEGATIVE mg/dL
Leukocytes,Ua: NEGATIVE
Nitrite: NEGATIVE
Protein, ur: NEGATIVE mg/dL
Specific Gravity, Urine: 1.02 (ref 1.005–1.030)
Urobilinogen, UA: 0.2 mg/dL (ref 0.0–1.0)
pH: 7 (ref 5.0–8.0)

## 2022-06-05 MED ORDER — ALBUTEROL SULFATE HFA 108 (90 BASE) MCG/ACT IN AERS: 2.0000 | INHALATION_SPRAY | Freq: Four times a day (QID) | RESPIRATORY_TRACT | 1 refills | Status: AC | PRN

## 2022-06-05 MED ORDER — NAPROXEN 500 MG PO TABS: 500.0000 mg | ORAL_TABLET | Freq: Two times a day (BID) | ORAL | 0 refills | Status: DC

## 2022-06-05 MED ORDER — ALBUTEROL SULFATE (2.5 MG/3ML) 0.083% IN NEBU
INHALATION_SOLUTION | RESPIRATORY_TRACT | Status: AC
  Filled 2022-06-05: qty 3

## 2022-06-05 MED ORDER — ALBUTEROL SULFATE (2.5 MG/3ML) 0.083% IN NEBU
2.5000 mg | INHALATION_SOLUTION | RESPIRATORY_TRACT | Status: DC
Start: 2022-06-05 — End: 2022-06-05
  Administered 2022-06-05: 2.5 mg via RESPIRATORY_TRACT

## 2022-06-05 MED ORDER — SPACER/AERO-HOLD CHAMBER BAGS MISC: 1.0000 [IU] | Freq: Four times a day (QID) | 0 refills | Status: DC

## 2022-06-05 MED ORDER — ALBUTEROL SULFATE (2.5 MG/3ML) 0.083% IN NEBU: 2.5000 mg | INHALATION_SOLUTION | Freq: Once | RESPIRATORY_TRACT | Status: DC

## 2022-06-05 MED ORDER — DM-GUAIFENESIN ER 30-600 MG PO TB12: 1.0000 | ORAL_TABLET | Freq: Two times a day (BID) | ORAL | 0 refills | Status: DC

## 2022-06-05 MED ORDER — HYDROCHLOROTHIAZIDE 25 MG PO TABS: 25.0000 mg | ORAL_TABLET | Freq: Every day | ORAL | 2 refills | Status: DC

## 2022-06-05 NOTE — ED Provider Notes (Signed)
[facility]    CSN: [id number] Arrival date & time: 06/05/22  1026      History   Chief Complaint Chief Complaint  Patient presents with   Headache   Cough   Back Pain    HPI Jordan Mcbride is a 51 y.o. female.   51 year old female presents with back pain, cough, wheezing and shortness of breath.  Patient indicates for the past month that she has been having persistent and progressive cough, chest congestion with clear and thick production, wheezing, and shortness of breath that is worse with activity.  Patient relates she also has some mild upper respiratory congestion with mild rhinitis, but without fever or chills.  Patient does relate that she has had some body aches and pains associated with her cough, without fever or chills.  Patient also indicates that she has been having some pain with her cough that is moderate and not relieved with ibuprofen 800 mg.  Patient denies frequency, urgency, or dysuria. Patient indicates that while she was in [place] she was diagnosed with malaria, she received an injection for treatment and she indicates that she is finished taking the tablets used to treat the malaria infection.   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   Hyperprolactinemia ([place]) 07/14/2017   Morbid obesity ([place]) 36/64/4034   Umbilical hernia 74/25/9563   Internal hemorrhoids 02/19/2012    Past Surgical History:  Procedure Laterality Date   EYE SURGERY     removed eyelid cyst   INSERTION OF MESH N/A 02/18/2017   Procedure: INSERTION OF MESH;  Surgeon: Fanny Skates, MD;  Location: [facility];  Service: General;  Laterality:  N/A;   UMBILICAL HERNIA REPAIR N/A 02/18/2017   Procedure: REPAIR UMBILICAL HERNIA WITH MESH;  Surgeon: Fanny Skates, MD;  Location: [facility];  Service: General;  Laterality: N/A;    OB History     Gravida  5   Para  3   Term  3   Preterm      AB  2   Living  3      SAB  2   IAB      Ectopic      Multiple      Live Births  3            Home Medications    Prior to Admission medications   Medication Sig Start Date End Date Taking? Authorizing Provider  albuterol (VENTOLIN HFA) 108 (90 Base) MCG/ACT inhaler Inhale 2 puffs into the lungs every 6 (six) hours as needed for wheezing or shortness of breath. 06/05/22  Yes Nyoka Lint, PA-C  dextromethorphan-guaiFENesin [facility] DM) 30-600 MG 12hr tablet Take 1 tablet by mouth 2 (two) times daily. 06/05/22  Yes Nyoka Lint, PA-C  hydrochlorothiazide (HYDRODIURIL) 25 MG tablet Take 1 tablet (25 mg total) by mouth daily. In morning to help control blood pressure. 06/05/22  Yes Nyoka Lint, PA-C  naproxen (NAPROSYN) 500 MG tablet Take 1 tablet (500 mg total) by mouth 2 (two) times daily. For pain relief. (Take with food) 06/05/22  Yes Nyoka Lint, PA-C  Spacer/Aero-Hold Chamber Bags MISC  1 Units by Does not apply route 4 (four) times daily. 06/05/22  Yes Nyoka Lint, PA-C  meclizine (ANTIVERT) 25 MG tablet Take 1 tablet (25 mg total) by mouth 3 (three) times daily as needed for dizziness. 01/15/22   Mesner, Corene Cornea, MD    Family History Family History  Problem Relation Age of Onset   Healthy Mother     Social History Social History   Tobacco Use   Smoking status: Never   Smokeless tobacco: Never  Vaping Use   Vaping Use: Never used  Substance Use Topics   Alcohol use: Not Currently    Comment: socially   Drug use: No     Allergies   Patient has no known allergies.   Review of Systems Review of Systems  Respiratory:  Positive for cough.   Musculoskeletal:  Positive for back pain.   Neurological:  Positive for headaches.     Physical Exam Triage Vital Signs ED Triage Vitals  Enc Vitals Group     BP 06/05/22 1105 (!) 157/102     Pulse Rate 06/05/22 1105 76     Resp 06/05/22 1105 18     Temp 06/05/22 1105 98.3 F (36.8 C)     Temp Source 06/05/22 1105 Oral     SpO2 06/05/22 1105 100 %     Weight --      Height --      Head Circumference --      Peak Flow --      Pain Score 06/05/22 1103 10     Pain Loc --      Pain Edu? --      Excl. in [place]? --    No data found.  Updated Vital Signs BP (!) 157/102 (BP Location: Right Arm)   Pulse 76   Temp 98.3 F (36.8 C) (Oral)   Resp 18   LMP 05/20/2022 (Approximate)   SpO2 100%   Visual Acuity Right Eye Distance:   Left Eye Distance:   Bilateral Distance:    Right Eye Near:   Left Eye Near:    Bilateral Near:     Physical Exam Constitutional:      Appearance: She is well-developed.  HENT:     Right Ear: Tympanic membrane and ear canal normal.     Left Ear: Tympanic membrane and ear canal normal.     Mouth/Throat:     Mouth: Mucous membranes are moist.  Cardiovascular:     Rate and Rhythm: Normal rate and regular rhythm.     Heart sounds: Normal heart sounds.  Pulmonary:     Effort: Pulmonary effort is normal.     Breath sounds: Normal breath sounds and air entry. No wheezing, rhonchi or rales.  Lymphadenopathy:     Cervical: No cervical adenopathy.  Neurological:     Mental Status: She is alert.      UC Treatments / Results  Labs (all labs ordered are listed, but only abnormal results are displayed) Labs Reviewed  POCT URINALYSIS DIPSTICK, ED / UC    EKG   Radiology DG Chest 2 View  Result Date: 06/05/2022 CLINICAL DATA:  Months of coughing with chest congestion EXAM: CHEST - 2 VIEW COMPARISON:  12/12/2016 FINDINGS: Normal heart size and mediastinal contours for leftward rotation. Mildly low lung volumes, stable. There is no edema, consolidation, effusion, or pneumothorax.  Generalized thoracic disc space narrowing and ridging. IMPRESSION: No active cardiopulmonary disease. Electronically Signed   By: Gilford Silvius.D.  On: 06/05/2022 12:16    Procedures Procedures (including critical care time)  Medications Ordered in UC Medications  albuterol (PROVENTIL) (2.5 MG/3ML) 0.083% nebulizer solution 2.5 mg (2.5 mg Nebulization Not Given 06/05/22 1157)    Initial Impression / Assessment and Plan / UC Course  I have reviewed the triage vital signs and the nursing notes.  Pertinent labs & imaging results that were available during my care of the patient were reviewed by me and considered in my medical decision making (see chart for details).    Plan: 1.  Advised to use the albuterol inhaler with a spacer, 2 puffs every 6 hours to help reduce wheezing and cough. 2.  Advised take Mucinex DM every 12 hours to help control the cough and chest congestion. 3.  Advised to start HCTZ 25 mg 1 tablet in the morning to help control blood pressure. 4.  Advised take the Naprosyn 500 mg every 12 hours with food to help control pain. 5.  Advised to become established with PCP through [facility] health in order to have continued medical issues appropriately addressed. Final Clinical Impressions(s) / UC Diagnoses   Final diagnoses:  Viral upper respiratory tract infection  Bronchitis  Wheezing  SOB (shortness of breath)  Essential hypertension     Discharge Instructions      Advised to use the albuterol inhaler with spacer, 2 puffs every 6 hours on a regular basis to help decrease wheezing, cough, and shortness of breath. Advised to take the Mucinex DM every 12 hours to help control the cough and chest congestion. Advised to take the hydrochlorothiazide 25 mg once daily in the morning to help control blood pressure. Advised take the Naprosyn 500 mg every 12 hours with food to help control pain. Advised to become established with a PCP for further evaluation if symptoms do  not improve.  You can go on  [email or web address] and be able to pick a family medicine or internal medicine specialist that is near your residence.    ED Prescriptions     Medication Sig Dispense Auth. Provider   albuterol (VENTOLIN HFA) 108 (90 Base) MCG/ACT inhaler Inhale 2 puffs into the lungs every 6 (six) hours as needed for wheezing or shortness of breath. 18 g Nyoka Lint, PA-C   Spacer/Aero-Hold Chamber Bags MISC 1 Units by Does not apply route 4 (four) times daily. 1 Units Nyoka Lint, PA-C   dextromethorphan-guaiFENesin [facility] DM) 30-600 MG 12hr tablet Take 1 tablet by mouth 2 (two) times daily. 20 tablet Nyoka Lint, PA-C   hydrochlorothiazide (HYDRODIURIL) 25 MG tablet Take 1 tablet (25 mg total) by mouth daily. In morning to help control blood pressure. 30 tablet Nyoka Lint, PA-C   naproxen (NAPROSYN) 500 MG tablet Take 1 tablet (500 mg total) by mouth 2 (two) times daily. For pain relief. (Take with food) 30 tablet Nyoka Lint, PA-C      PDMP not reviewed this encounter.   Nyoka Lint, PA-C 06/05/22 1224

## 2022-06-05 NOTE — ED Triage Notes (Signed)
Pt states that she has lower back pain, cough, headache x 1 month. She has been taking OTC meds including IBU without relief. Yesterday she felt confused she couldn't remember where she parked her car.

## 2022-06-05 NOTE — Discharge Instructions (Addendum)
Advised to use the albuterol inhaler with spacer, 2 puffs every 6 hours on a regular basis to help decrease wheezing, cough, and shortness of breath. Advised to take the Mucinex DM every 12 hours to help control the cough and chest congestion. Advised to take the hydrochlorothiazide 25 mg once daily in the morning to help control blood pressure. Advised take the Naprosyn 500 mg every 12 hours with food to help control pain. Advised to become established with a PCP for further evaluation if symptoms do not improve.  You can go on  [email or web address] and be able to pick a family medicine or internal medicine specialist that is near your residence.

## 2023-09-07 ENCOUNTER — Other Ambulatory Visit: Payer: Self-pay

## 2023-09-07 ENCOUNTER — Ambulatory Visit
Admission: EM | Admit: 2023-09-07 | Discharge: 2023-09-07 | Disposition: A | Discharge status: Other Acute Inpatient Hospital | Payer: Medicaid Other | Attending: Physician Assistant | Admitting: Physician Assistant

## 2023-09-07 ENCOUNTER — Emergency Department (HOSPITAL_COMMUNITY)
Admission: EM | Admit: 2023-09-07 | Discharge: 2023-09-08 | Disposition: A | Discharge status: Home / Self Care | Payer: Medicaid Other | Attending: Emergency Medicine | Admitting: Emergency Medicine

## 2023-09-07 ENCOUNTER — Emergency Department (HOSPITAL_COMMUNITY): Payer: Medicaid Other

## 2023-09-07 ENCOUNTER — Encounter: Payer: Self-pay | Admitting: *Deleted

## 2023-09-07 DIAGNOSIS — R42 Dizziness and giddiness: Secondary | ICD-10-CM | POA: Insufficient documentation

## 2023-09-07 DIAGNOSIS — R0602 Shortness of breath: Secondary | ICD-10-CM

## 2023-09-07 DIAGNOSIS — M7989 Other specified soft tissue disorders: Secondary | ICD-10-CM | POA: Insufficient documentation

## 2023-09-07 DIAGNOSIS — Z1152 Encounter for screening for COVID-19: Secondary | ICD-10-CM | POA: Diagnosis not present

## 2023-09-07 DIAGNOSIS — R079 Chest pain, unspecified: Secondary | ICD-10-CM

## 2023-09-07 LAB — CBC WITH DIFFERENTIAL/PLATELET
Abs Immature Granulocytes: 0 10*3/uL (ref 0.00–0.07)
Basophils Absolute: 0 10*3/uL (ref 0.0–0.1)
Basophils Relative: 1 %
Eosinophils Absolute: 0.2 10*3/uL (ref 0.0–0.5)
Eosinophils Relative: 4 %
HCT: 36 % (ref 36.0–46.0)
Hemoglobin: 11.3 g/dL — ABNORMAL LOW (ref 12.0–15.0)
Immature Granulocytes: 0 %
Lymphocytes Relative: 30 %
Lymphs Abs: 1.5 10*3/uL (ref 0.7–4.0)
MCH: 27.9 pg (ref 26.0–34.0)
MCHC: 31.4 g/dL (ref 30.0–36.0)
MCV: 88.9 fL (ref 80.0–100.0)
Monocytes Absolute: 0.4 10*3/uL (ref 0.1–1.0)
Monocytes Relative: 7 %
Neutro Abs: 2.9 10*3/uL (ref 1.7–7.7)
Neutrophils Relative %: 58 %
Platelets: 239 10*3/uL (ref 150–400)
RBC: 4.05 MIL/uL (ref 3.87–5.11)
RDW: 14.4 % (ref 11.5–15.5)
WBC: 5 10*3/uL (ref 4.0–10.5)
nRBC: 0 % (ref 0.0–0.2)

## 2023-09-07 MED ORDER — ASPIRIN 81 MG PO CHEW
324.0000 mg | CHEWABLE_TABLET | Freq: Once | ORAL | Status: AC
  Administered 2023-09-07: 324 mg via ORAL
  Filled 2023-09-07: qty 4

## 2023-09-07 NOTE — ED Triage Notes (Addendum)
Patient BIB [facility] from UC for dizziness lasting 10 seconds at a time and also for head, neck and shoulder pain x 3 days. Patient has hx of brain tumor. Patient also c/o foot pain and swelling for x 5 days VSS, A&Ox4.

## 2023-09-07 NOTE — ED Triage Notes (Signed)
Pt reports bilateral ankle swelling x 6 days and "strong" chest pain x 3 days with dizziness. States she is not taking any medicines, only tylenol

## 2023-09-07 NOTE — ED Notes (Signed)
Patient is being discharged from the Urgent Care and sent to the Emergency Department via EMS . Per Dorann Ou, PA-C, patient is in need of higher level of care due to Chest pain with dizziness. Patient is aware and verbalizes understanding of plan of care.  Vitals:   09/07/23 1958  BP: (!) 160/97  Pulse: 66  Resp: 20  Temp: 98 F (36.7 C)  SpO2: 99%

## 2023-09-07 NOTE — ED Provider Triage Note (Signed)
  Emergency Medicine Provider Triage Evaluation Note  MRN:  [id number]  Arrival date & time: 09/07/23    Medically screening exam initiated at 10:13 PM.   CC:   Dizziness and Foot Pain   HPI:  Jordan Mcbride is a 52 y.o. year-old female presents to the ED with chief complaint of chest pain, left arm pain, right leg swelling. Hx of brain tumor.  Sent by urgent care.  History provided by patient. ROS:  -As included in HPI PE:   Vitals:   09/07/23 2122  BP: (!) 164/91  Pulse: 64  Resp: 18  Temp: 98.2 F (36.8 C)  SpO2: 100%    Non-toxic appearing No respiratory distress  MDM:  Based on signs and symptoms, ACS vs PE vs CHF vs other is highest on my differential. I've ordered labs and imaging in triage to expedite lab/diagnostic workup.  Patient's vitals are currently stable.  Ideally she'd get a room, but given that we are well beyond capacity, she'll have to wait.  As long as vitals remain stable, she can wait in the lobby with tests pending.  Patient was informed that the remainder of the evaluation will be completed by another provider, this initial triage assessment does not replace that evaluation, and the importance of remaining in the ED until their evaluation is complete.    Roxy Horseman, PA-C 09/07/23 2218

## 2023-09-07 NOTE — ED Provider Notes (Signed)
[facility]    CSN: [id number] Arrival date & time: 09/07/23  1806      History   Chief Complaint Chief Complaint  Patient presents with   Chest Pain    HPI Jordan Mcbride is a 52 y.o. female.   Patient presents today with a 3-day history of chest pain.  She reports over the past several days she has also had some leg swelling which has resulted in discomfort.  She reports that chest pain is rated 8 on a 0-10 pain scale, described as a pressure, no aggravating relieving factors identified.  She has tried Tylenol without improvement.  The pain radiates into her neck/head as well as her arm.  She does report associated shortness of breath, nausea, dizziness.  Chest pain is intermittent without identifiable trigger and is not associated with physical activity.  She denies any significant past medical history such as CAD, hypertension, hyperlipidemia, diabetes.  It does appear that she has been prescribed antihypertensive medication in the past but is not currently taking this.  She denies family history of cardiovascular disease.  She reports the pain is worsening prompting evaluation.    Past Medical History:  Diagnosis Date   Brain tumor [facility])    micro-pituitary adenoma   Ovarian cyst    Umbilical hernia 02/18/2017    Patient Active Problem List   Diagnosis Date Noted   Prolactinoma ([facility]) 10/13/2017   Hyperprolactinemia ([facility]) 07/14/2017   Morbid obesity ([facility]) 07/14/2017   Umbilical hernia 02/18/2017   Internal hemorrhoids 02/19/2012    Past Surgical History:  Procedure Laterality Date   EYE SURGERY     removed eyelid cyst   INSERTION OF MESH N/A 02/18/2017   Procedure: INSERTION OF MESH;  Surgeon: Claud Kelp, MD;  Location: [facility];  Service: General;  Laterality: N/A;   UMBILICAL HERNIA REPAIR N/A 02/18/2017   Procedure: REPAIR UMBILICAL HERNIA WITH MESH;  Surgeon: Claud Kelp, MD;  Location: [facility];  Service:  General;  Laterality: N/A;    OB History     Gravida  5   Para  3   Term  3   Preterm      AB  2   Living  3      SAB  2   IAB      Ectopic      Multiple      Live Births  3            Home Medications    Prior to Admission medications   Medication Sig Start Date End Date Taking? Authorizing Provider  albuterol (VENTOLIN HFA) 108 (90 Base) MCG/ACT inhaler Inhale 2 puffs into the lungs every 6 (six) hours as needed for wheezing or shortness of breath. 06/05/22   Ellsworth Lennox, PA-C  dextromethorphan-guaiFENesin [facility] DM) 30-600 MG 12hr tablet Take 1 tablet by mouth 2 (two) times daily. 06/05/22   Ellsworth Lennox, PA-C  hydrochlorothiazide (HYDRODIURIL) 25 MG tablet Take 1 tablet (25 mg total) by mouth daily. In morning to help control blood pressure. 06/05/22   Ellsworth Lennox, PA-C  meclizine (ANTIVERT) 25 MG tablet Take 1 tablet (25 mg total) by mouth 3 (three) times daily as needed for dizziness. 01/15/22   Mesner, Barbara Cower, MD  naproxen (NAPROSYN) 500 MG tablet Take 1 tablet (500 mg total) by mouth 2 (two) times daily. For pain relief. (Take with food) 06/05/22   Ellsworth Lennox, PA-C  Spacer/Aero-Hold Chamber Bags MISC 1 Units by Does  not apply route 4 (four) times daily. 06/05/22   Ellsworth Lennox, PA-C    Family History Family History  Problem Relation Age of Onset   Healthy Mother     Social History Social History   Tobacco Use   Smoking status: Never   Smokeless tobacco: Never  Vaping Use   Vaping status: Never Used  Substance Use Topics   Alcohol use: Yes    Comment: socially   Drug use: No     Allergies   Patient has no known allergies.   Review of Systems Review of Systems  Constitutional:  Positive for activity change. Negative for appetite change, diaphoresis, fatigue and fever.  Respiratory:  Positive for shortness of breath.   Cardiovascular:  Positive for chest pain and leg swelling. Negative for palpitations.  Gastrointestinal:  Positive for  nausea. Negative for abdominal pain, diarrhea and vomiting.  Neurological:  Positive for dizziness. Negative for light-headedness and headaches.     Physical Exam Triage Vital Signs ED Triage Vitals  Encounter Vitals Group     BP 09/07/23 1958 (!) 160/97     Systolic BP Percentile --      Diastolic BP Percentile --      Pulse Rate 09/07/23 1958 66     Resp 09/07/23 1958 20     Temp 09/07/23 1958 98 F (36.7 C)     Temp Source 09/07/23 1958 Oral     SpO2 09/07/23 1958 99 %     Weight --      Height --      Head Circumference --      Peak Flow --      Pain Score 09/07/23 2000 8     Pain Loc --      Pain Education --      Exclude from Growth Chart --    No data found.  Updated Vital Signs BP (!) 160/97 (BP Location: Right Arm)   Pulse 66   Temp 98 F (36.7 C) (Oral)   Resp 20   SpO2 99%   Visual Acuity Right Eye Distance:   Left Eye Distance:   Bilateral Distance:    Right Eye Near:   Left Eye Near:    Bilateral Near:     Physical Exam Vitals reviewed.  Constitutional:      General: She is awake. She is not in acute distress.    Appearance: Normal appearance. She is well-developed. She is not ill-appearing.     Comments: Very pleasant female appears stated age in no acute distress sitting comfortably in exam room  HENT:     Head: Normocephalic and atraumatic.  Cardiovascular:     Rate and Rhythm: Normal rate and regular rhythm.     Heart sounds: Normal heart sounds, S1 normal and S2 normal. No murmur heard. Pulmonary:     Effort: Pulmonary effort is normal.     Breath sounds: Normal breath sounds. No wheezing, rhonchi or rales.     Comments: Clear to auscultation bilaterally Chest:     Chest wall: No deformity, swelling or tenderness.     Comments: Pain is not reproducible on exam Abdominal:     General: Bowel sounds are normal.     Palpations: Abdomen is soft.     Tenderness: There is no abdominal tenderness. There is no right CVA tenderness, left CVA  tenderness, guarding or rebound.  Musculoskeletal:     Right lower leg: 1+ Edema present.     Left lower leg: 1+  Edema present.  Psychiatric:        Behavior: Behavior is cooperative.      UC Treatments / Results  Labs (all labs ordered are listed, but only abnormal results are displayed) Labs Reviewed - No data to display  EKG   Radiology No results found.  Procedures Procedures (including critical care time)  Medications Ordered in UC Medications - No data to display  Initial Impression / Assessment and Plan / UC Course  I have reviewed the triage vital signs and the nursing notes.  Pertinent labs & imaging results that were available during my care of the patient were reviewed by me and considered in my medical decision making (see chart for details).     Patient is well-appearing, afebrile, nontoxic, nontachycardic.  EKG was obtained that showed normal sinus rhythm without ischemic changes; compared to 01/04/2022 tracing no significant change.  Given she is having ongoing chest pain with an Interchest score of 2 I recommend she go to the emergency room for further evaluation and management.  She was dropped off by her family member and did not feel safe transporting herself so EMS was called.  She was stable at the time of discharge and taken to the emergency room by EMS.  Final Clinical Impressions(s) / UC Diagnoses   Final diagnoses:  Chest pain, unspecified type  SOB (shortness of breath)   Discharge Instructions   None    ED Prescriptions   None    PDMP not reviewed this encounter.   Jeani Hawking, PA-C 09/07/23 2032

## 2023-09-07 NOTE — ED Notes (Signed)
I discharge this pt, she was not ready, and I was in the wrong location on the computer log in.

## 2023-09-08 LAB — BASIC METABOLIC PANEL
Anion gap: 9 (ref 5–15)
BUN: 17 mg/dL (ref 6–20)
CO2: 26 mmol/L (ref 22–32)
Calcium: 9.1 mg/dL (ref 8.9–10.3)
Chloride: 110 mmol/L (ref 98–111)
Creatinine, Ser: 1 mg/dL (ref 0.44–1.00)
GFR, Estimated: >60 mL/min (ref >60)
Glucose, Bld: 100 mg/dL — ABNORMAL HIGH (ref 70–99)
Potassium: 3.4 mmol/L — ABNORMAL LOW (ref 3.5–5.1)
Sodium: 145 mmol/L (ref 135–145)

## 2023-09-08 LAB — RESP PANEL BY RT-PCR (RSV, FLU A&B, COVID)  RVPGX2
Influenza A by PCR: NEGATIVE
Influenza B by PCR: NEGATIVE
Resp Syncytial Virus by PCR: NEGATIVE
SARS Coronavirus 2 by RT PCR: NEGATIVE

## 2023-09-08 LAB — TROPONIN I (HIGH SENSITIVITY)
Troponin I (High Sensitivity): 5 ng/L (ref <18)
Troponin I (High Sensitivity): 7 ng/L (ref <18)

## 2023-09-08 LAB — D-DIMER, QUANTITATIVE: D-Dimer, Quant: <0.27 ug{FEU}/mL (ref 0.00–0.50)

## 2023-09-08 MED ORDER — OXYCODONE-ACETAMINOPHEN 5-325 MG PO TABS: 1.0000 | ORAL_TABLET | Freq: Four times a day (QID) | ORAL | 0 refills | Status: AC | PRN

## 2023-09-08 MED ORDER — FUROSEMIDE 20 MG PO TABS: 20.0000 mg | ORAL_TABLET | Freq: Every day | ORAL | 0 refills | Status: AC

## 2023-09-08 MED ORDER — OXYCODONE-ACETAMINOPHEN 5-325 MG PO TABS
1.0000 | ORAL_TABLET | Freq: Once | ORAL | Status: AC
Start: 2023-09-08 — End: 2023-09-08
  Administered 2023-09-08: 1 via ORAL
  Filled 2023-09-08: qty 1

## 2023-09-08 NOTE — Discharge Instructions (Addendum)
You should buy compression stockings (socks) at the pharmacy to wear in the daytime, when you are standing on your feet.  These can help squeeze fluid out of your legs.  Try to keep your legs elevated at home on the couch or a chair to waist-level, as much as possible.  You were given an opioid narcotic medicine in the ER.  Do not drive today or perform dangerous activities.

## 2023-09-08 NOTE — ED Provider Notes (Signed)
[place] EMERGENCY DEPARTMENT AT [facility] Provider Note   CSN: [id number] Arrival date & time: 09/07/23  2129     History  CC: Leg swelling, chest pain   Jordan Mcbride is a 52 y.o. female presenting to the ED with chest pain onset several days ago, intermittent, last experienced it last night.  Went to UC yesterday and was referred to the ED for further evaluation.  Unfortunately due to prolonged waiting times, the patient after triage has been approximately 18 hours in the waiting room prior to being evaluated to start of my shift.  She reports complete resolution of her chest pain since.  She had blood test and EKG and chest x-ray performed overnight.  She reports that she has had symmetrical lower extremity swelling for several days.  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  She denies history of heart failure.  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  She reports she sometimes get episodes of dizziness but not persistently.  She is currently asymptomatic.  HPI     Home Medications Prior to Admission medications   Medication Sig Start Date End Date Taking? Authorizing Provider  albuterol (VENTOLIN HFA) 108 (90 Base) MCG/ACT inhaler Inhale 2 puffs into the lungs every 6 (six) hours as needed for wheezing or shortness of breath. Patient not taking: Reported on 09/08/2023 06/05/22   Ellsworth Lennox, PA-C  furosemide (LASIX) 20 MG tablet Take 1 tablet (20 mg total) by mouth daily for 5 days. 09/08/23 09/13/23 Yes Othon Guardia, Kermit Balo, MD  oxyCODONE-acetaminophen (PERCOCET/ROXICET) 5-325 MG tablet Take 1 tablet by mouth every 6 (six) hours as needed for up to 12 doses for severe pain (pain score 7-10). 09/08/23  Yes Rianna Lukes, Kermit Balo, MD      Allergies    Patient has no known allergies.    Review of Systems   Review of Systems  Physical Exam Updated Vital Signs BP (!) 154/98 (BP  Location: Right Arm)   Pulse 69   Temp 99.3 F (37.4 C) (Oral)   Resp 18   SpO2 98%  Physical Exam Constitutional:      General: She is not in acute distress.    Appearance: She is obese.  HENT:     Head: Normocephalic and atraumatic.  Eyes:     Conjunctiva/sclera: Conjunctivae normal.     Pupils: Pupils are equal, round, and reactive to light.  Cardiovascular:     Rate and Rhythm: Normal rate and regular rhythm.  Pulmonary:     Effort: Pulmonary effort is normal. No respiratory distress.  Abdominal:     General: There is no distension.     Tenderness: There is no abdominal tenderness.  Musculoskeletal:     Comments: Symmetrical pitting edema of the lower extremity to the mid shin  Skin:    General: Skin is warm and dry.  Neurological:     General: No focal deficit present.     Mental Status: She is alert. Mental status is at baseline.  Psychiatric:        Mood and Affect: Mood normal.        Behavior: Behavior normal.     ED Results / Procedures / Treatments   Labs (all labs ordered are listed, but only abnormal results are displayed) Labs Reviewed  CBC WITH DIFFERENTIAL/PLATELET - Abnormal; Notable for the following components:  Result Value   Hemoglobin 11.3 (*)    All other components within normal limits  BASIC METABOLIC PANEL - Abnormal; Notable for the following components:   Potassium 3.4 (*)    Glucose, Bld 100 (*)    All other components within normal limits  RESP PANEL BY RT-PCR (RSV, FLU A&B, COVID)  RVPGX2  D-DIMER, QUANTITATIVE  TROPONIN I (HIGH SENSITIVITY)  TROPONIN I (HIGH SENSITIVITY)    EKG EKG Interpretation Date/Time:  Monday September 07 2023 22:28:37 EST Ventricular Rate:  65 PR Interval:  178 QRS Duration:  86 QT Interval:  416 QTC Calculation: 432 R Axis:   11  Text Interpretation: Normal sinus rhythm Normal ECG When compared with ECG of 07-Sep-2023 20:14, PREVIOUS ECG IS PRESENT Confirmed by Gwyneth Sprout ([id number]) on  09/07/2023 11:05:14 PM  Radiology DG Chest Port 1 View  Result Date: 09/08/2023 CLINICAL DATA:  Shortness of breath EXAM: PORTABLE CHEST 1 VIEW COMPARISON:  06/05/2022 FINDINGS: Cardiac shadow is within normal limits. Lungs are well aerated bilaterally. No focal infiltrate or sizable effusion is seen. No bony abnormality is noted. IMPRESSION: No active disease. Electronically Signed   By: Alcide Clever M.D.   On: 09/08/2023 00:05    Procedures Procedures    Medications Ordered in ED Medications  aspirin chewable tablet 324 mg (324 mg Oral Given 09/07/23 2346)  oxyCODONE-acetaminophen (PERCOCET/ROXICET) 5-325 MG per tablet 1 tablet (1 tablet Oral Given 09/08/23 1551)    ED Course/ Medical Decision Making/ A&P                                 Medical Decision Making Risk Prescription drug management.   This patient presents to the Emergency Department with complaint of chest pain. This involves an extensive number of treatment options, and is a complaint that carries with it a high risk of complications and morbidity. The differential diagnosis includes ACS vs Pneumothorax vs Reflux/Gastritis vs MSK pain vs Pneumonia vs other.  I felt PE was less likely given that the patient has a negative D-dimer.  She also does not have unilateral leg swelling to suggest a DVT, this appears to be symmetrical in nature more suggestive of lymphedema of the lower extremities.  I ordered, reviewed, and interpreted labs.  Pertinent results include delta troponins, negative D-dimer, no leukocytosis, no acute anemia Percocet was ordered for lower extremity pain.  Patient was not having active chest pain at this time I ordered imaging studies which included x-ray of the chest I independently visualized and interpreted imaging which showed no emergent findings. I agree with the radiologist interpretation External records obtained and reviewed showing MRI of the brain with and without contrast in April 2021  which was motion degraded study but showed a mild interval decrease in the size pituitary adenoma I personally reviewed the patients ECG which showed sinus rhythm with no acute ischemic findings  After the interventions stated above, I reevaluated the patient and found that they were stable and improved  Based on the patient's clinical exam, vital signs, risk factors, and ED testing, I felt that the patient's overall risk of life-threatening emergency such as ACS, PE, sepsis, or infection was low.  At this time, I felt the patient's presentation was most clinically consistent with nonspecific chest pain, but explained to the patient that this evaluation was not a definitive diagnostic workup.  Refer the patient to cardiology given her complaint of chest pain referring  into the left arm, feeling like pressure.  She is overall HEART score < 3  and reasonable for outpatient workup but reports no prior cardiac workup.  I also recommended compression stockings and elevation of the lower extremity we will try 5 days of Lasix to help with the edema in her lower leg which I suspect is the cause of her pain.  Finally I recommended she touch base again with her neurosurgeon or outpatient provider who was monitoring her pituitary adenomas as it has been nearly 3 years since imaging        Final Clinical Impression(s) / ED Diagnoses Final diagnoses:  Chest pain, unspecified type  Dizziness  Leg swelling    Rx / DC Orders ED Discharge Orders          Ordered    furosemide (LASIX) 20 MG tablet  Daily        09/08/23 1557    oxyCODONE-acetaminophen (PERCOCET/ROXICET) 5-325 MG tablet  Every 6 hours PRN        09/08/23 1557              Terald Sleeper, MD 09/08/23 1558

## 2023-09-08 NOTE — ED Notes (Signed)
Updated on wait for treatment room. 

## 2024-01-01 ENCOUNTER — Emergency Department (HOSPITAL_COMMUNITY)

## 2024-01-01 ENCOUNTER — Other Ambulatory Visit: Payer: Self-pay

## 2024-01-01 ENCOUNTER — Encounter (HOSPITAL_COMMUNITY): Payer: Self-pay | Admitting: Emergency Medicine

## 2024-01-01 ENCOUNTER — Emergency Department (HOSPITAL_COMMUNITY)
Admission: EM | Admit: 2024-01-01 | Discharge: 2024-01-01 | Disposition: A | Discharge status: Home / Self Care | Attending: Emergency Medicine | Admitting: Emergency Medicine

## 2024-01-01 DIAGNOSIS — F410 Panic disorder [episodic paroxysmal anxiety] without agoraphobia: Secondary | ICD-10-CM | POA: Diagnosis not present

## 2024-01-01 DIAGNOSIS — R079 Chest pain, unspecified: Secondary | ICD-10-CM

## 2024-01-01 LAB — COMPREHENSIVE METABOLIC PANEL WITH GFR
ALT: 17 U/L (ref 0–44)
AST: 38 U/L (ref 15–41)
Albumin: 3.7 g/dL (ref 3.5–5.0)
Alkaline Phosphatase: 83 U/L (ref 38–126)
Anion gap: 8 (ref 5–15)
BUN: 16 mg/dL (ref 6–20)
CO2: 23 mmol/L (ref 22–32)
Calcium: 8.6 mg/dL — ABNORMAL LOW (ref 8.9–10.3)
Chloride: 110 mmol/L (ref 98–111)
Creatinine, Ser: 0.92 mg/dL (ref 0.44–1.00)
GFR, Estimated: >60 mL/min (ref >60)
Glucose, Bld: 88 mg/dL (ref 70–99)
Potassium: 4.3 mmol/L (ref 3.5–5.1)
Sodium: 141 mmol/L (ref 135–145)
Total Bilirubin: 1.1 mg/dL (ref 0.0–1.2)
Total Protein: 6.9 g/dL (ref 6.5–8.1)

## 2024-01-01 LAB — URINALYSIS, ROUTINE W REFLEX MICROSCOPIC
Bilirubin Urine: NEGATIVE
Glucose, UA: NEGATIVE mg/dL
Hgb urine dipstick: NEGATIVE
Ketones, ur: NEGATIVE mg/dL
Leukocytes,Ua: NEGATIVE
Nitrite: NEGATIVE
Protein, ur: NEGATIVE mg/dL
Specific Gravity, Urine: 1.006 (ref 1.005–1.030)
pH: 6 (ref 5.0–8.0)

## 2024-01-01 LAB — CBC WITH DIFFERENTIAL/PLATELET
Abs Immature Granulocytes: 0 10*3/uL (ref 0.00–0.07)
Basophils Absolute: 0 10*3/uL (ref 0.0–0.1)
Basophils Relative: 1 %
Eosinophils Absolute: 0.2 10*3/uL (ref 0.0–0.5)
Eosinophils Relative: 4 %
HCT: 37 % (ref 36.0–46.0)
Hemoglobin: 11.2 g/dL — ABNORMAL LOW (ref 12.0–15.0)
Immature Granulocytes: 0 %
Lymphocytes Relative: 25 %
Lymphs Abs: 1.1 10*3/uL (ref 0.7–4.0)
MCH: 27.5 pg (ref 26.0–34.0)
MCHC: 30.3 g/dL (ref 30.0–36.0)
MCV: 90.7 fL (ref 80.0–100.0)
Monocytes Absolute: 0.4 10*3/uL (ref 0.1–1.0)
Monocytes Relative: 9 %
Neutro Abs: 2.7 10*3/uL (ref 1.7–7.7)
Neutrophils Relative %: 61 %
Platelets: 224 10*3/uL (ref 150–400)
RBC: 4.08 MIL/uL (ref 3.87–5.11)
RDW: 13.7 % (ref 11.5–15.5)
WBC: 4.3 10*3/uL (ref 4.0–10.5)
nRBC: 0 % (ref 0.0–0.2)

## 2024-01-01 LAB — TROPONIN I (HIGH SENSITIVITY)
Troponin I (High Sensitivity): 7 ng/L (ref <18)
Troponin I (High Sensitivity): 5 ng/L (ref <18)

## 2024-01-01 MED ORDER — LABETALOL HCL 5 MG/ML IV SOLN
10.0000 mg | Freq: Once | INTRAVENOUS | Status: AC
  Administered 2024-01-01: 10 mg via INTRAVENOUS
  Filled 2024-01-01: qty 4

## 2024-01-01 NOTE — ED Provider Notes (Signed)
 [place] EMERGENCY DEPARTMENT AT [facility] Provider Note   CSN: [id number] Arrival date & time: 01/01/24  1207     History  Chief Complaint  Patient presents with   Anxiety    Jordan Mcbride is a 53 y.o. female, history of micropituitary adenoma, who presents to the ED secondary to chest pain, that started after she was very stressed out at work.  She states she has had a lot of home stress, and has had a lot of fighting with her children.  She was at work, when she started having some depressurized chest pain, without any shortness of breath, nausea, or vomiting.  She felt very weak, so an ambulance was called.  She denies any cardiac history, does not smoke.  States she still feels a little bit of chest pressure, but overall feels better.  Home Medications Prior to Admission medications   Medication Sig Start Date End Date Taking? Authorizing Provider  albuterol (VENTOLIN HFA) 108 (90 Base) MCG/ACT inhaler Inhale 2 puffs into the lungs every 6 (six) hours as needed for wheezing or shortness of breath. Patient not taking: Reported on 09/08/2023 06/05/22   Ellsworth Lennox, PA-C  furosemide (LASIX) 20 MG tablet Take 1 tablet (20 mg total) by mouth daily for 5 days. 09/08/23 09/13/23  Terald Sleeper, MD  oxyCODONE-acetaminophen (PERCOCET/ROXICET) 5-325 MG tablet Take 1 tablet by mouth every 6 (six) hours as needed for up to 12 doses for severe pain (pain score 7-10). 09/08/23   Terald Sleeper, MD      Allergies    Patient has no known allergies.    Review of Systems   Review of Systems  Respiratory:  Negative for shortness of breath.   Cardiovascular:  Positive for chest pain.    Physical Exam Updated Vital Signs BP (!) 149/100   Pulse 95   Temp 98.6 F (37 C)   Resp 19   Ht 5\' 5"  (1.651 m)   Wt 119 kg   LMP  (Approximate)   SpO2 97%   BMI 43.66 kg/m  Physical Exam Vitals and nursing note reviewed.  Constitutional:      General: She is not in acute  distress.    Appearance: She is well-developed.  HENT:     Head: Normocephalic and atraumatic.  Eyes:     Conjunctiva/sclera: Conjunctivae normal.  Cardiovascular:     Rate and Rhythm: Normal rate and regular rhythm.     Heart sounds: No murmur heard. Pulmonary:     Effort: Pulmonary effort is normal. No respiratory distress.     Breath sounds: Normal breath sounds.  Abdominal:     Palpations: Abdomen is soft.     Tenderness: There is no abdominal tenderness.  Musculoskeletal:        General: No swelling.     Cervical back: Neck supple.  Skin:    General: Skin is warm and dry.     Capillary Refill: Capillary refill takes less than 2 seconds.  Neurological:     Mental Status: She is alert.  Psychiatric:        Mood and Affect: Mood normal.     ED Results / Procedures / Treatments   Labs (all labs ordered are listed, but only abnormal results are displayed) Labs Reviewed  CBC WITH DIFFERENTIAL/PLATELET - Abnormal; Notable for the following components:      Result Value   Hemoglobin 11.2 (*)    All other components within normal limits  COMPREHENSIVE METABOLIC  PANEL WITH GFR - Abnormal; Notable for the following components:   Calcium 8.6 (*)    All other components within normal limits  URINALYSIS, ROUTINE W REFLEX MICROSCOPIC - Abnormal; Notable for the following components:   Color, Urine STRAW (*)    All other components within normal limits  TROPONIN I (HIGH SENSITIVITY)  TROPONIN I (HIGH SENSITIVITY)    EKG EKG Interpretation Date/Time:  Friday January 01 2024 12:25:01 EDT Ventricular Rate:  78 PR Interval:  190 QRS Duration:  97 QT Interval:  417 QTC Calculation: 475 R Axis:   40  Text Interpretation: Sinus rhythm No significant change since last tracing Confirmed by Linwood Dibbles [phone]) on 01/01/2024 12:30:39 PM  Radiology DG Chest Portable 1 View Result Date: 01/01/2024 CLINICAL DATA:  Chest pain. EXAM: PORTABLE CHEST 1 VIEW COMPARISON:  September 07, 2023.  FINDINGS: Stable cardiomediastinal silhouette. Both lungs are clear. The visualized skeletal structures are unremarkable. IMPRESSION: No active disease. Electronically Signed   By: Lupita Raider M.D.   On: 01/01/2024 14:34    Procedures Procedures    Medications Ordered in ED Medications  labetalol (NORMODYNE) injection 10 mg (10 mg Intravenous Given 01/01/24 1507)    ED Course/ Medical Decision Making/ A&P             HEART Score: 2                    Medical Decision Making Patient is a 53 year old female, here for chest pain, while at work.  She states she has been very stressed out by children, having pressurized chest pain, has been going on for the last couple hours.  States she got very weak as well.  Will obtain troponins, chest x-ray, for further evaluation  Amount and/or Complexity of Data Reviewed Labs: ordered.    Details: Troponin within normal limits, unremarkable urinalysis Radiology: ordered.    Details: Chest x-ray clear ECG/medicine tests:  Decision-making details documented in ED Course. Discussion of management or test interpretation with external provider(s): Heart score of 2, patient well-appearing, troponin x 2 within normal limits, is overall well-appearing, will discharge home with strict return precautions.  She is feeling better, now, I am suspicious that her chest pain was likely secondary to an anxiety attack.  Discharged home with strict return precautions.  Instructed to follow-up with primary care doctor in regards to her blood pressure management.  Risk Prescription drug management.    Final Clinical Impression(s) / ED Diagnoses Final diagnoses:  Anxiety attack  Chest pain, unspecified type    Rx / DC Orders ED Discharge Orders     None         Kotaro Buer Elbert Ewings, PA 01/01/24 1732    Rexford Maus, DO 01/04/24 [phone]

## 2024-01-01 NOTE — ED Triage Notes (Signed)
 Pt bib [facility] for panic attack at work. Patient is alert but remains with eyes closed. Not answering a lot of questions and tearful. Patient complains of weakness and pain in chest area. She whispers when talking

## 2024-01-01 NOTE — ED Notes (Signed)
 Patient request to use restroom before able to get back to patient she pulled all cords out and went to bathroom without assistance.  Patient does admit to not feeling safe at home.

## 2024-01-01 NOTE — ED Notes (Signed)
 Patient does open up a little about children not being friendly. Not wanting them to see her because they are talk mean to her all but her son.

## 2024-01-01 NOTE — Discharge Instructions (Addendum)
 Your chest x-ray and blood work was reassuring.  You are not having a heart attack, I think that your chest pain was likely secondary to from your anxiety attack.  Please follow-up with your primary care doctor, and a psychiatrist.  Return to the ER if you have worsening chest pain, shortness of breath, intractable nausea or vomiting.

## 2024-07-18 ENCOUNTER — Emergency Department (HOSPITAL_COMMUNITY)
Admission: EM | Admit: 2024-07-18 | Discharge: 2024-07-18 | Disposition: A | Discharge status: Home / Self Care | Attending: Emergency Medicine | Admitting: Emergency Medicine

## 2024-07-18 ENCOUNTER — Other Ambulatory Visit: Payer: Self-pay

## 2024-07-18 ENCOUNTER — Encounter (HOSPITAL_COMMUNITY): Payer: Self-pay

## 2024-07-18 ENCOUNTER — Emergency Department (HOSPITAL_COMMUNITY)

## 2024-07-18 DIAGNOSIS — R1032 Left lower quadrant pain: Secondary | ICD-10-CM | POA: Diagnosis not present

## 2024-07-18 DIAGNOSIS — R682 Dry mouth, unspecified: Secondary | ICD-10-CM | POA: Diagnosis not present

## 2024-07-18 DIAGNOSIS — R109 Unspecified abdominal pain: Secondary | ICD-10-CM

## 2024-07-18 DIAGNOSIS — R1030 Lower abdominal pain, unspecified: Secondary | ICD-10-CM | POA: Diagnosis present

## 2024-07-18 LAB — URINALYSIS, ROUTINE W REFLEX MICROSCOPIC
Bilirubin Urine: NEGATIVE
Glucose, UA: NEGATIVE mg/dL
Hgb urine dipstick: NEGATIVE
Ketones, ur: NEGATIVE mg/dL
Leukocytes,Ua: NEGATIVE
Nitrite: NEGATIVE
Protein, ur: NEGATIVE mg/dL
Specific Gravity, Urine: 1.014 (ref 1.005–1.030)
pH: 7 (ref 5.0–8.0)

## 2024-07-18 LAB — CBC WITH DIFFERENTIAL/PLATELET
Abs Immature Granulocytes: 0.01 K/uL (ref 0.00–0.07)
Basophils Absolute: 0 K/uL (ref 0.0–0.1)
Basophils Relative: 1 %
Eosinophils Absolute: 0.2 K/uL (ref 0.0–0.5)
Eosinophils Relative: 5 %
HCT: 36.9 % (ref 36.0–46.0)
Hemoglobin: 11.6 g/dL — ABNORMAL LOW (ref 12.0–15.0)
Immature Granulocytes: 0 %
Lymphocytes Relative: 32 %
Lymphs Abs: 1.3 K/uL (ref 0.7–4.0)
MCH: 27.6 pg (ref 26.0–34.0)
MCHC: 31.4 g/dL (ref 30.0–36.0)
MCV: 87.6 fL (ref 80.0–100.0)
Monocytes Absolute: 0.2 K/uL (ref 0.1–1.0)
Monocytes Relative: 6 %
Neutro Abs: 2.3 K/uL (ref 1.7–7.7)
Neutrophils Relative %: 56 %
Platelets: 240 K/uL (ref 150–400)
RBC: 4.21 MIL/uL (ref 3.87–5.11)
RDW: 14 % (ref 11.5–15.5)
WBC: 4 K/uL (ref 4.0–10.5)
nRBC: 0 % (ref 0.0–0.2)

## 2024-07-18 LAB — COMPREHENSIVE METABOLIC PANEL WITH GFR
ALT: 19 U/L (ref 0–44)
AST: 22 U/L (ref 15–41)
Albumin: 4.3 g/dL (ref 3.5–5.0)
Alkaline Phosphatase: 142 U/L — ABNORMAL HIGH (ref 38–126)
Anion gap: 9 (ref 5–15)
BUN: 14 mg/dL (ref 6–20)
CO2: 27 mmol/L (ref 22–32)
Calcium: 9.2 mg/dL (ref 8.9–10.3)
Chloride: 105 mmol/L (ref 98–111)
Creatinine, Ser: 0.89 mg/dL (ref 0.44–1.00)
GFR, Estimated: >60 mL/min (ref >60)
Glucose, Bld: 94 mg/dL (ref 70–99)
Potassium: 3.9 mmol/L (ref 3.5–5.1)
Sodium: 141 mmol/L (ref 135–145)
Total Bilirubin: 0.5 mg/dL (ref 0.0–1.2)
Total Protein: 7.1 g/dL (ref 6.5–8.1)

## 2024-07-18 LAB — LIPASE, BLOOD: Lipase: 41 U/L (ref 11–51)

## 2024-07-18 MED ORDER — SODIUM CHLORIDE 0.9 % IV BOLUS
1000.0000 mL | Freq: Once | INTRAVENOUS | Status: AC
  Administered 2024-07-18: 1000 mL via INTRAVENOUS

## 2024-07-18 MED ORDER — ONDANSETRON 4 MG PO TBDP: 4.0000 mg | ORAL_TABLET | Freq: Three times a day (TID) | ORAL | 0 refills | Status: AC | PRN

## 2024-07-18 MED ORDER — IOHEXOL 300 MG/ML  SOLN
100.0000 mL | Freq: Once | INTRAMUSCULAR | Status: AC | PRN
  Administered 2024-07-18: 100 mL via INTRAVENOUS

## 2024-07-18 NOTE — ED Provider Notes (Signed)
 [place] EMERGENCY DEPARTMENT AT [facility] Provider Note   CSN: [id number] Arrival date & time: 07/18/24  1045     Patient presents with: Abdominal Pain   Jordan Mcbride is a 53 y.o. female.   Patient here with lower abdominal pain that been on and off for the last 2 weeks.  She got little bit of discomfort to some sores in her tongue that are chronic.  She is mainly here for her abdominal pain.  Seems to get worse at times but does not know what makes it worse or better.  She denies any nausea vomiting or diarrhea.  No pain with urination.  History of pituitary adenoma umbilical hernia.  She has had hernia surgery repair.  She denies any issues passing gas.  Denies any constipation.  The history is provided by the patient.       Prior to Admission medications   Medication Sig Start Date End Date Taking? Authorizing Provider  ondansetron  (ZOFRAN -ODT) 4 MG disintegrating tablet Take 1 tablet (4 mg total) by mouth every 8 (eight) hours as needed for nausea or vomiting. 07/18/24  Yes Terry Abila, DO  albuterol  (VENTOLIN  HFA) 108 (90 Base) MCG/ACT inhaler Inhale 2 puffs into the lungs every 6 (six) hours as needed for wheezing or shortness of breath. Patient not taking: Reported on 09/08/2023 06/05/22   Lynwood Lenis, PA-C  furosemide  (LASIX ) 20 MG tablet Take 1 tablet (20 mg total) by mouth daily for 5 days. 09/08/23 09/13/23  Cottie Donnice PARAS, MD  oxyCODONE -acetaminophen  (PERCOCET/ROXICET) 5-325 MG tablet Take 1 tablet by mouth every 6 (six) hours as needed for up to 12 doses for severe pain (pain score 7-10). 09/08/23   Cottie Donnice PARAS, MD    Allergies: Patient has no known allergies.    Review of Systems  Updated Vital Signs BP (!) 151/110 (BP Location: Left Arm)   Pulse 69   Temp (!) 97.5 F (36.4 C) (Oral)   Resp 18   LMP 05/20/2022 (Approximate)   SpO2 100%   Physical Exam Vitals and nursing note reviewed.  Constitutional:      General: She is not  in acute distress.    Appearance: She is well-developed. She is not ill-appearing.  HENT:     Head: Normocephalic and atraumatic.     Mouth/Throat:     Mouth: Mucous membranes are moist.  Eyes:     Extraocular Movements: Extraocular movements intact.     Conjunctiva/sclera: Conjunctivae normal.     Pupils: Pupils are equal, round, and reactive to light.  Cardiovascular:     Rate and Rhythm: Normal rate and regular rhythm.     Heart sounds: Normal heart sounds. No murmur heard. Pulmonary:     Effort: Pulmonary effort is normal. No respiratory distress.     Breath sounds: Normal breath sounds.  Abdominal:     General: Abdomen is flat.     Palpations: Abdomen is soft.     Tenderness: There is abdominal tenderness in the suprapubic area and left lower quadrant.  Musculoskeletal:        General: No swelling.     Cervical back: Neck supple.  Skin:    General: Skin is warm and dry.     Capillary Refill: Capillary refill takes less than 2 seconds.  Neurological:     Mental Status: She is alert.  Psychiatric:        Mood and Affect: Mood normal.     (all labs ordered are listed,  but only abnormal results are displayed) Labs Reviewed  COMPREHENSIVE METABOLIC PANEL WITH GFR - Abnormal; Notable for the following components:      Result Value   Alkaline Phosphatase 142 (*)    All other components within normal limits  CBC WITH DIFFERENTIAL/PLATELET - Abnormal; Notable for the following components:   Hemoglobin 11.6 (*)    All other components within normal limits  LIPASE, BLOOD  URINALYSIS, ROUTINE W REFLEX MICROSCOPIC    EKG: None  Radiology: CT ABDOMEN PELVIS W CONTRAST Result Date: 07/18/2024 CLINICAL DATA:  Acute abdominal pain for 2 weeks EXAM: CT ABDOMEN AND PELVIS WITH CONTRAST TECHNIQUE: Multidetector CT imaging of the abdomen and pelvis was performed using the standard protocol following bolus administration of intravenous contrast. RADIATION DOSE REDUCTION: This exam  was performed according to the departmental dose-optimization program which includes automated exposure control, adjustment of the mA and/or kV according to patient size and/or use of iterative reconstruction technique. CONTRAST:  OMNIPAQUE  IOHEXOL  300 MG/ML  SOLN COMPARISON:  07/20/2018 FINDINGS: Lower chest: No acute pleural or parenchymal lung disease. Hepatobiliary: No focal liver abnormality is seen. No gallstones, gallbladder wall thickening, or biliary dilatation. Pancreas: Unremarkable. No pancreatic ductal dilatation or surrounding inflammatory changes. Spleen: Normal in size without focal abnormality. Adrenals/Urinary Tract: Right pelvic kidney again noted. Otherwise the kidneys enhance normally and symmetrically. The adrenals are unremarkable. Bladder is grossly normal. Stomach/Bowel: No bowel obstruction or ileus. Normal appendix right lower quadrant. Scattered sigmoid diverticulosis without diverticulitis. No bowel wall thickening or inflammatory change. Vascular/Lymphatic: No significant vascular findings are present. No enlarged abdominal or pelvic lymph nodes. Reproductive: Uterus and bilateral adnexa are unremarkable. Other: No free fluid or free intraperitoneal gas. No abdominal wall hernia. Musculoskeletal: No acute or destructive bony abnormalities. Reconstructed images demonstrate no additional findings. IMPRESSION: 1. No acute intra-abdominal or intrapelvic process. Normal appendix. Electronically Signed   By: Ozell Daring M.D.   On: 07/18/2024 21:21     Procedures   Medications Ordered in the ED  sodium chloride  0.9 % bolus 1,000 mL (1,000 mLs Intravenous New Bag/Given 07/18/24 2000)  iohexol  (OMNIPAQUE ) 300 MG/ML solution 100 mL (100 mLs Intravenous Contrast Given 07/18/24 2102)                                    Medical Decision Making Amount and/or Complexity of Data Reviewed Labs: ordered. Radiology: ordered.  Risk Prescription drug management.   Jordan Mcbride is here with abdominal pain.  History of umbilical surgery hernia repair.  She is having some dry mouth sores on her tongue but do not appreciate any obvious sores on her tongue.  She looks a little bit dehydrated but I do not appreciate any abnormality to her lip or tongues of the posterior oropharynx.  She is mainly here for lower abdominal pain.  Differential diagnosis could be constipation colitis bowel obstruction less likely kidney stone but will get basic labs CT scan abdomen pelvis and reevaluate.  Will give her IV fluids.  Overall lab work shows no significant leukocytosis anemia or electrolyte abnormality.  Urinalysis negative for infection.  CT scan abdomen pelvis with no acute findings.  Overall no emergent findings on workup today.  She is feeling better after some fluids.  Recommend continue supportive care at home with Zofran  as needed.  Continue Tylenol  and ibuprofen  for pain.  Follow-up with primary care doctor.  Return if symptoms worsen.  This chart was dictated using voice recognition software.  Despite best efforts to proofread,  errors can occur which can change the documentation meaning.      Final diagnoses:  Abdominal pain, unspecified abdominal location  Dry mouth    ED Discharge Orders          Ordered    ondansetron  (ZOFRAN -ODT) 4 MG disintegrating tablet  Every 8 hours PRN        07/18/24 2137               Ruthe Cornet, DO 07/18/24 2138

## 2024-07-18 NOTE — ED Triage Notes (Signed)
 Pt reports with abdominal pain x 2 weeks. Pt also states that her mouth is swelling and her saliva is thick.

## 2024-07-18 NOTE — ED Provider Triage Note (Signed)
 Emergency Medicine Provider Triage Evaluation Note  Jordan Mcbride , a 53 y.o. female  was evaluated in triage.  Pt complains of abdominal pain and oral lesions. Patient reports that her symptoms began 2 weeks ago with sores in her mouth like pimples on my tongue, she feels that these have resolved but now endorses sharp/sudden abdominal pain, lasting for several minutes at a time before resolving entirely. Pain comes on at random and is not exacerbated by eating, no nausea/vomiting/diarrhea, pain is not currently present. She admits she has primarily eaten liquids for the past 2 weeks due to the pain. Reports a fever 2 days ago which she believes was very high, around 120F, when asked if she means 100.2 or 102 she says no. Denies dysuria but notes white stuff in urine.  Review of Systems  Positive: As above Negative: As above  Physical Exam  BP (!) 145/108 (BP Location: Left Arm)   Pulse 75   Temp 98.5 F (36.9 C) (Oral)   Resp 20   LMP 05/20/2022 (Approximate)   SpO2 100%  Gen:   Awake, no distress   Resp:  Normal effort  MSK:   Moves extremities without difficulty  Other:  Abdomen is soft and without TTP, no CVA tenderness  Medical Decision Making  Medically screening exam initiated at 1:12 PM.  Appropriate orders placed.  Jordan Mcbride was informed that the remainder of the evaluation will be completed by another provider, this initial triage assessment does not replace that evaluation, and the importance of remaining in the ED until their evaluation is complete.     Jordan Mcbride, [place] 07/18/24 1316

## 2024-07-18 NOTE — Discharge Instructions (Signed)
 Take Zofran  as needed for any further nausea feeling.  Follow-up with your primary care doctor.  Return if symptoms worsen.

## 2024-08-22 ENCOUNTER — Ambulatory Visit (HOSPITAL_COMMUNITY)
Admission: RE | Admit: 2024-08-22 | Discharge: 2024-08-22 | Disposition: A | Discharge status: Home / Self Care | Source: Ambulatory Visit | Attending: Emergency Medicine | Admitting: Emergency Medicine

## 2024-08-22 ENCOUNTER — Emergency Department (HOSPITAL_COMMUNITY)

## 2024-08-22 ENCOUNTER — Encounter (HOSPITAL_COMMUNITY): Payer: Self-pay

## 2024-08-22 ENCOUNTER — Other Ambulatory Visit: Payer: Self-pay

## 2024-08-22 ENCOUNTER — Emergency Department (HOSPITAL_COMMUNITY)
Admission: EM | Admit: 2024-08-22 | Discharge: 2024-08-22 | Disposition: A | Discharge status: Home / Self Care | Attending: Emergency Medicine | Admitting: Emergency Medicine

## 2024-08-22 DIAGNOSIS — M25562 Pain in left knee: Secondary | ICD-10-CM | POA: Diagnosis present

## 2024-08-22 DIAGNOSIS — M79605 Pain in left leg: Secondary | ICD-10-CM | POA: Insufficient documentation

## 2024-08-22 DIAGNOSIS — R52 Pain, unspecified: Secondary | ICD-10-CM

## 2024-08-22 MED ORDER — KETOROLAC TROMETHAMINE 15 MG/ML IJ SOLN
15.0000 mg | Freq: Once | INTRAMUSCULAR | Status: AC
  Administered 2024-08-22: 15 mg via INTRAMUSCULAR
  Filled 2024-08-22: qty 1

## 2024-08-22 MED ORDER — MORPHINE SULFATE 15 MG PO TABS: 7.5000 mg | ORAL_TABLET | ORAL | 0 refills | Status: AC | PRN

## 2024-08-22 MED ORDER — DIAZEPAM 5 MG PO TABS
5.0000 mg | ORAL_TABLET | Freq: Once | ORAL | Status: AC
  Administered 2024-08-22: 5 mg via ORAL
  Filled 2024-08-22: qty 1

## 2024-08-22 MED ORDER — OXYCODONE HCL 5 MG PO TABS
5.0000 mg | ORAL_TABLET | Freq: Once | ORAL | Status: AC
Start: 2024-08-22 — End: 2024-08-22
  Administered 2024-08-22: 5 mg via ORAL
  Filled 2024-08-22: qty 1

## 2024-08-22 MED ORDER — ACETAMINOPHEN 500 MG PO TABS
1000.0000 mg | ORAL_TABLET | Freq: Once | ORAL | Status: AC
  Administered 2024-08-22: 1000 mg via ORAL
  Filled 2024-08-22: qty 2

## 2024-08-22 NOTE — ED Provider Notes (Signed)
 [place] EMERGENCY DEPARTMENT AT [facility] Provider Note   CSN: [id number] Arrival date & time: 08/22/24  9663     Patient presents with: Knee Pain   Jordan Mcbride is a 53 y.o. female.   53 yo F with a cc of left knee pain.  Patient says she went to [place] for a wedding and started having pain afterwards.  Pain mostly with straightening out of the leg.  She had some much pain after she got off the airplane that she came here immediately.  Denies trauma.  Denies fevers.   Knee Pain      Prior to Admission medications   Medication Sig Start Date End Date Taking? Authorizing Provider  morphine  (MSIR) 15 MG tablet Take 0.5 tablets (7.5 mg total) by mouth every 4 (four) hours as needed. 08/22/24  Yes Emil Share, DO  albuterol  (VENTOLIN  HFA) 108 (90 Base) MCG/ACT inhaler Inhale 2 puffs into the lungs every 6 (six) hours as needed for wheezing or shortness of breath. Patient not taking: Reported on 09/08/2023 06/05/22   Lynwood Lenis, PA-C  furosemide  (LASIX ) 20 MG tablet Take 1 tablet (20 mg total) by mouth daily for 5 days. 09/08/23 09/13/23  Cottie Donnice PARAS, MD  ondansetron  (ZOFRAN -ODT) 4 MG disintegrating tablet Take 1 tablet (4 mg total) by mouth every 8 (eight) hours as needed for nausea or vomiting. 07/18/24   Curatolo, Adam, DO  oxyCODONE -acetaminophen  (PERCOCET/ROXICET) 5-325 MG tablet Take 1 tablet by mouth every 6 (six) hours as needed for up to 12 doses for severe pain (pain score 7-10). 09/08/23   Cottie Donnice PARAS, MD    Allergies: Patient has no known allergies.    Review of Systems  Updated Vital Signs BP (!) 151/119 (BP Location: Left Arm)   Pulse 84   Temp 98.4 F (36.9 C) (Oral)   Resp 16   LMP 05/20/2022 (Approximate)   SpO2 100%   Physical Exam Vitals and nursing note reviewed.  Constitutional:      General: She is not in acute distress.    Appearance: She is well-developed. She is not diaphoretic.     Comments: BMI 50  HENT:      Head: Normocephalic and atraumatic.  Eyes:     Pupils: Pupils are equal, round, and reactive to light.  Cardiovascular:     Rate and Rhythm: Normal rate and regular rhythm.     Heart sounds: No murmur heard.    No friction rub. No gallop.  Pulmonary:     Effort: Pulmonary effort is normal.     Breath sounds: No wheezing or rales.  Abdominal:     General: There is no distension.     Palpations: Abdomen is soft.     Tenderness: There is no abdominal tenderness.  Musculoskeletal:        General: Tenderness present.     Cervical back: Normal range of motion and neck supple.     Comments: Tenderness mostly to the posterior aspect of the left knee at the attachment of the hamstring to the tibia.  I am able to range the knee but she has some discomfort with full extension.  Difficult to assess ligaments or meniscus due to discomfort.  Pulse motor and sensation are intact distally.  Skin:    General: Skin is warm and dry.  Neurological:     Mental Status: She is alert and oriented to person, place, and time.  Psychiatric:  Behavior: Behavior normal.     (all labs ordered are listed, but only abnormal results are displayed) Labs Reviewed - No data to display  EKG: None  Radiology: DG Knee Complete 4 Views Left Result Date: 08/22/2024 CLINICAL DATA:  Left knee pain and swelling. EXAM: DG KNEE COMPLETE 4+V*L* COMPARISON:  None Available. FINDINGS: Mild loss of joint space noted medial compartment. No evidence for an acute fracture. No subluxation or dislocation. No joint effusions. IMPRESSION: Mild degenerative changes in the medial compartment. No acute bony findings. Electronically Signed   By: Camellia Candle M.D.   On: 08/22/2024 04:57     Procedures   Medications Ordered in the ED  ketorolac  (TORADOL ) 15 MG/ML injection 15 mg (15 mg Intramuscular Given 08/22/24 0423)  acetaminophen  (TYLENOL ) tablet 1,000 mg (1,000 mg Oral Given 08/22/24 0422)  oxyCODONE  (Oxy  IR/ROXICODONE ) immediate release tablet 5 mg (5 mg Oral Given 08/22/24 0422)  diazepam  (VALIUM ) tablet 5 mg (5 mg Oral Given 08/22/24 0422)                                    Medical Decision Making Amount and/or Complexity of Data Reviewed Radiology: ordered.  Risk OTC drugs. Prescription drug management.   53 yo F with a chief complaints of left knee pain.  Atraumatic going on for a couple days.  Recently traveled to [place] for a wedding.  Started having pain on the plane flight back.  Difficult exam due to discomfort.  Not consistent with septic arthritis.  Ace wrap crutches.  Plain film of the knee independently interpreted by me without fracture.  Will give orthopedic follow-up.  5:09 AM:  I have discussed the diagnosis/risks/treatment options with the patient.  Evaluation and diagnostic testing in the emergency department does not suggest an emergent condition requiring admission or immediate intervention beyond what has been performed at this time.  They will follow up with Ortho. We also discussed returning to the ED immediately if new or worsening sx occur. We discussed the sx which are most concerning (e.g., sudden worsening pain, fever, inability to tolerate by mouth) that necessitate immediate return. Medications administered to the patient during their visit and any new prescriptions provided to the patient are listed below.  Medications given during this visit Medications  ketorolac  (TORADOL ) 15 MG/ML injection 15 mg (15 mg Intramuscular Given 08/22/24 0423)  acetaminophen  (TYLENOL ) tablet 1,000 mg (1,000 mg Oral Given 08/22/24 0422)  oxyCODONE  (Oxy IR/ROXICODONE ) immediate release tablet 5 mg (5 mg Oral Given 08/22/24 0422)  diazepam  (VALIUM ) tablet 5 mg (5 mg Oral Given 08/22/24 0422)     The patient appears reasonably screen and/or stabilized for discharge and I doubt any other medical condition or other [facility] requiring further screening, evaluation, or treatment  in the ED at this time prior to discharge.       Final diagnoses:  Acute pain of left knee    ED Discharge Orders          Ordered    morphine  (MSIR) 15 MG tablet  Every 4 hours PRN        08/22/24 0508               Emil Share, DO 08/22/24 [phone]

## 2024-08-22 NOTE — ED Triage Notes (Signed)
 Pt reports with left knee and leg pain/swelling since last Wednesday. Pt states that she is unable to straighten out her leg.

## 2024-08-22 NOTE — Discharge Instructions (Addendum)
 Your x-ray looks okay.  No obvious broken bones.  Try to keep your weight off of it as best you can.  I have given you information to follow-up with orthopedics in clinic.  Please call them to set up an appointment.  I think it is unlikely that you would have a blood clot in the leg that would be this painful.  We do not have ultrasound at this time a day to perform the study.  I will place an order for you to get it done this morning if you would like to return to have it performed.  Take 4 over the counter ibuprofen  tablets 3 times a day or 2 over-the-counter naproxen  tablets twice a day for pain. Also take tylenol  1000mg (2 extra strength) four times a day.   Then take the pain medicine if you feel like you need it. Narcotics do not help with the pain, they only make you care about it less.  You can become addicted to this, people may break into your house to steal it.  It will constipate you.  If you drive under the influence of this medicine you can get a DUI.

## 2024-09-05 ENCOUNTER — Emergency Department (HOSPITAL_COMMUNITY)

## 2024-09-05 ENCOUNTER — Encounter (HOSPITAL_COMMUNITY): Payer: Self-pay

## 2024-09-05 ENCOUNTER — Emergency Department (HOSPITAL_COMMUNITY)
Admission: EM | Admit: 2024-09-05 | Discharge: 2024-09-05 | Disposition: A | Discharge status: Home / Self Care | Attending: Emergency Medicine | Admitting: Emergency Medicine

## 2024-09-05 DIAGNOSIS — S99912A Unspecified injury of left ankle, initial encounter: Secondary | ICD-10-CM

## 2024-09-05 DIAGNOSIS — M25532 Pain in left wrist: Secondary | ICD-10-CM

## 2024-09-05 MED ORDER — IBUPROFEN 600 MG PO TABS: 600.0000 mg | ORAL_TABLET | Freq: Four times a day (QID) | ORAL | 0 refills | Status: DC | PRN

## 2024-09-05 MED ORDER — IBUPROFEN 200 MG PO TABS
600.0000 mg | ORAL_TABLET | Freq: Once | ORAL | Status: AC
  Administered 2024-09-05: 600 mg via ORAL
  Filled 2024-09-05: qty 3

## 2024-09-05 MED ORDER — IBUPROFEN 600 MG PO TABS: 600.0000 mg | ORAL_TABLET | Freq: Four times a day (QID) | ORAL | 0 refills | Status: AC | PRN
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
## 2024-10-29 ENCOUNTER — Emergency Department (HOSPITAL_COMMUNITY)
Admission: EM | Admit: 2024-10-29 | Discharge: 2024-10-29 | Disposition: A | Discharge status: Home / Self Care | Attending: Emergency Medicine | Admitting: Emergency Medicine

## 2024-10-29 ENCOUNTER — Emergency Department (HOSPITAL_COMMUNITY)

## 2024-10-29 DIAGNOSIS — S99912D Unspecified injury of left ankle, subsequent encounter: Secondary | ICD-10-CM | POA: Diagnosis present

## 2024-10-29 DIAGNOSIS — S93402D Sprain of unspecified ligament of left ankle, subsequent encounter: Secondary | ICD-10-CM | POA: Insufficient documentation

## 2024-10-29 DIAGNOSIS — M25562 Pain in left knee: Secondary | ICD-10-CM | POA: Diagnosis not present

## 2024-10-29 MED ORDER — MELOXICAM 15 MG PO TABS: 15.0000 mg | ORAL_TABLET | Freq: Every day | ORAL | 2 refills | Status: AC

## 2024-10-29 NOTE — Discharge Instructions (Addendum)
 Return if any problems.
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
## 2024-10-29 NOTE — ED Provider Notes (Signed)
 " [place] EMERGENCY DEPARTMENT AT [facility] Provider Note   CSN: [id number] Arrival date & time: 10/29/24  9073     Patient presents with: Ankle Pain   Jordan Mcbride is a 54 y.o. female.   Pt complains of pain in her left ankle and her left knee. Pt reports she has had knee pain since November.  Pt injured her ankle a month ago.  Pt reports she has continued to have pain.   The history is provided by the patient. No language interpreter was used.  Ankle Pain Location:  Knee and ankle Knee location:  L knee Ankle location:  L ankle Pain details:    Quality:  Aching   Radiates to:  Does not radiate   Timing:  Constant   Progression:  Worsening Chronicity:  New Prior injury to area:  Yes Relieved by:  Nothing Worsened by:  Nothing Ineffective treatments:  None tried      Prior to Admission medications  Medication Sig Start Date End Date Taking? Authorizing Provider  meloxicam  (MOBIC ) 15 MG tablet Take 1 tablet (15 mg total) by mouth daily. 10/29/24 10/29/25 Yes Flint Sonny POUR, PA-C  albuterol  (VENTOLIN  HFA) 108 (90 Base) MCG/ACT inhaler Inhale 2 puffs into the lungs every 6 (six) hours as needed for wheezing or shortness of breath. Patient not taking: Reported on 09/08/2023 06/05/22   Lynwood Lenis, PA-C  furosemide  (LASIX ) 20 MG tablet Take 1 tablet (20 mg total) by mouth daily for 5 days. 09/08/23 09/13/23  Cottie Donnice PARAS, MD  morphine  (MSIR) 15 MG tablet Take 0.5 tablets (7.5 mg total) by mouth every 4 (four) hours as needed. 08/22/24   Emil Share, DO  ondansetron  (ZOFRAN -ODT) 4 MG disintegrating tablet Take 1 tablet (4 mg total) by mouth every 8 (eight) hours as needed for nausea or vomiting. 07/18/24   Curatolo, Adam, DO  oxyCODONE -acetaminophen  (PERCOCET/ROXICET) 5-325 MG tablet Take 1 tablet by mouth every 6 (six) hours as needed for up to 12 doses for severe pain (pain score 7-10). 09/08/23   Cottie Donnice PARAS, MD    Allergies: Patient has no known  allergies.    Review of Systems  All other systems reviewed and are negative.   Updated Vital Signs BP (!) 156/91   Pulse 78   Temp 97.7 F (36.5 C) (Oral)   Resp 17   LMP 05/20/2022   SpO2 100%   Physical Exam Vitals and nursing note reviewed.  Constitutional:      Appearance: She is well-developed.  HENT:     Head: Normocephalic.  Cardiovascular:     Rate and Rhythm: Normal rate.  Pulmonary:     Effort: Pulmonary effort is normal.  Abdominal:     General: There is no distension.  Musculoskeletal:        General: Normal range of motion.     Cervical back: Normal range of motion.  Skin:    General: Skin is warm.  Neurological:     General: No focal deficit present.     Mental Status: She is alert and oriented to person, place, and time.  Psychiatric:        Mood and Affect: Mood normal.     (all labs ordered are listed, but only abnormal results are displayed) Labs Reviewed - No data to display  EKG: None  Radiology: DG Knee Complete 4 Views Left Result Date: 10/29/2024 EXAM: 4 VIEW(S) XRAY OF THE LEFT KNEE 10/29/2024 10:34:00 AM COMPARISON: 08/22/2024 CLINICAL HISTORY:  Pain. FINDINGS: BONES AND JOINTS: No acute fracture. No malalignment. No significant joint effusion. Mild medial compartment joint space narrowing. SOFT TISSUES: Unremarkable. IMPRESSION: 1. No acute findings. 2. Mild degenerative changes. Electronically signed by: Waddell Calk MD 10/29/2024 11:04 AM EST RP Workstation: [id number]   DG Ankle Complete Left Result Date: 10/29/2024 EXAM: 3 OR MORE VIEW(S) XRAY OF THE LEFT ANKLE 10/29/2024 10:34:00 AM CLINICAL HISTORY: Pain. COMPARISON: 09/05/2024 FINDINGS: BONES AND JOINTS: No acute fracture. No malalignment. Mild midfoot degenerative changes. Small Achilles tendon enthesophyte. SOFT TISSUES: Mild soft tissue edema. IMPRESSION: 1. Mild soft tissue edema. Electronically signed by: Waddell Calk MD 10/29/2024 11:03 AM EST RP Workstation: [id number]      Procedures   Medications Ordered in the ED - No data to display                                  Medical Decision Making Patient complains of pain in her left knee and her left ankle.  Patient states her left ankle still swollen after injuring it over a month ago.  She has had pain in her knee for over 2 months.  Amount and/or Complexity of Data Reviewed Radiology: ordered and independent interpretation performed. Decision-making details documented in ED Course.    Details: Left ankle soft tissue swelling Left knee degenerative cahnges.   Risk Prescription drug management. Risk Details: Patient given a prescription for meloxicam .  She is placed in an ASO.        Final diagnoses:  Acute pain of left knee  Sprain of left ankle, unspecified ligament, subsequent encounter    ED Discharge Orders          Ordered    meloxicam  (MOBIC ) 15 MG tablet  Daily        10/29/24 1120           An After Visit Summary was printed and given to the patient.     Flint Sonny POUR, [place] 10/29/24 1603  "
